# Patient Record
Sex: Female | Born: 1935 | Race: White | Hispanic: No | Marital: Married | Smoking: Never smoker
Health system: Southern US, Community
[De-identification: ages and names within clinical notes are randomized; demographics above are authoritative.]

## PROBLEM LIST (undated history)

## (undated) DIAGNOSIS — I10 Essential (primary) hypertension: Secondary | ICD-10-CM

## (undated) DIAGNOSIS — S2232XA Fracture of one rib, left side, initial encounter for closed fracture: Secondary | ICD-10-CM

## (undated) DIAGNOSIS — M1990 Unspecified osteoarthritis, unspecified site: Secondary | ICD-10-CM

## (undated) DIAGNOSIS — F419 Anxiety disorder, unspecified: Secondary | ICD-10-CM

## (undated) DIAGNOSIS — M810 Age-related osteoporosis without current pathological fracture: Secondary | ICD-10-CM

## (undated) DIAGNOSIS — E039 Hypothyroidism, unspecified: Secondary | ICD-10-CM

## (undated) DIAGNOSIS — E079 Disorder of thyroid, unspecified: Secondary | ICD-10-CM

## (undated) DIAGNOSIS — S32592A Other specified fracture of left pubis, initial encounter for closed fracture: Secondary | ICD-10-CM

## (undated) DIAGNOSIS — H3530 Unspecified macular degeneration: Secondary | ICD-10-CM

## (undated) DIAGNOSIS — J45909 Unspecified asthma, uncomplicated: Secondary | ICD-10-CM

## (undated) DIAGNOSIS — S42202A Unspecified fracture of upper end of left humerus, initial encounter for closed fracture: Secondary | ICD-10-CM

## (undated) HISTORY — DX: Anxiety disorder, unspecified: F41.9

## (undated) HISTORY — PX: EYE SURGERY: SHX253

## (undated) HISTORY — DX: Age-related osteoporosis without current pathological fracture: M81.0

## (undated) HISTORY — DX: Hypothyroidism, unspecified: E03.9

## (undated) HISTORY — DX: Unspecified macular degeneration: H35.30

## (undated) HISTORY — DX: Unspecified asthma, uncomplicated: J45.909

## (undated) HISTORY — PX: APPENDECTOMY: SHX54

---

## 1998-01-23 ENCOUNTER — Other Ambulatory Visit: Admission: RE | Admit: 1998-01-23 | Discharge: 1998-01-23 | Payer: Self-pay | Admitting: Internal Medicine

## 1998-12-13 ENCOUNTER — Ambulatory Visit (HOSPITAL_COMMUNITY): Admission: RE | Admit: 1998-12-13 | Discharge: 1998-12-13 | Payer: Self-pay | Admitting: Internal Medicine

## 1998-12-14 ENCOUNTER — Encounter: Payer: Self-pay | Admitting: Internal Medicine

## 1998-12-14 ENCOUNTER — Ambulatory Visit (HOSPITAL_COMMUNITY): Admission: RE | Admit: 1998-12-14 | Discharge: 1998-12-14 | Payer: Self-pay | Admitting: Internal Medicine

## 1999-02-27 ENCOUNTER — Encounter: Payer: Self-pay | Admitting: Internal Medicine

## 1999-02-27 ENCOUNTER — Encounter: Admission: RE | Admit: 1999-02-27 | Discharge: 1999-02-27 | Payer: Self-pay | Admitting: Internal Medicine

## 1999-03-13 ENCOUNTER — Ambulatory Visit (HOSPITAL_COMMUNITY): Admission: RE | Admit: 1999-03-13 | Discharge: 1999-03-13 | Payer: Self-pay | Admitting: Internal Medicine

## 2001-04-30 ENCOUNTER — Encounter: Payer: Self-pay | Admitting: Internal Medicine

## 2001-04-30 ENCOUNTER — Encounter: Admission: RE | Admit: 2001-04-30 | Discharge: 2001-04-30 | Payer: Self-pay | Admitting: Internal Medicine

## 2003-11-15 ENCOUNTER — Encounter (HOSPITAL_COMMUNITY): Admission: RE | Admit: 2003-11-15 | Discharge: 2004-02-13 | Payer: Self-pay | Admitting: Internal Medicine

## 2003-11-24 ENCOUNTER — Ambulatory Visit (HOSPITAL_COMMUNITY): Admission: RE | Admit: 2003-11-24 | Discharge: 2003-11-24 | Payer: Self-pay | Admitting: Internal Medicine

## 2004-11-15 ENCOUNTER — Encounter: Admission: RE | Admit: 2004-11-15 | Discharge: 2004-11-15 | Payer: Self-pay | Admitting: Internal Medicine

## 2005-11-18 ENCOUNTER — Encounter: Admission: RE | Admit: 2005-11-18 | Discharge: 2005-11-18 | Payer: Self-pay | Admitting: Internal Medicine

## 2007-10-01 ENCOUNTER — Emergency Department (HOSPITAL_COMMUNITY): Admission: EM | Admit: 2007-10-01 | Discharge: 2007-10-01 | Payer: Self-pay | Admitting: Emergency Medicine

## 2009-07-07 ENCOUNTER — Emergency Department (HOSPITAL_COMMUNITY): Admission: EM | Admit: 2009-07-07 | Discharge: 2009-07-07 | Payer: Self-pay | Admitting: Family Medicine

## 2011-05-02 ENCOUNTER — Encounter (INDEPENDENT_AMBULATORY_CARE_PROVIDER_SITE_OTHER): Payer: Medicare Other | Admitting: Ophthalmology

## 2011-05-02 DIAGNOSIS — H43819 Vitreous degeneration, unspecified eye: Secondary | ICD-10-CM

## 2011-05-02 DIAGNOSIS — H3530 Unspecified macular degeneration: Secondary | ICD-10-CM

## 2012-05-01 ENCOUNTER — Ambulatory Visit (INDEPENDENT_AMBULATORY_CARE_PROVIDER_SITE_OTHER): Payer: Medicare Other | Admitting: Ophthalmology

## 2012-05-04 ENCOUNTER — Ambulatory Visit (INDEPENDENT_AMBULATORY_CARE_PROVIDER_SITE_OTHER): Payer: Medicare Other | Admitting: Ophthalmology

## 2012-05-04 DIAGNOSIS — H35039 Hypertensive retinopathy, unspecified eye: Secondary | ICD-10-CM

## 2012-05-04 DIAGNOSIS — I10 Essential (primary) hypertension: Secondary | ICD-10-CM

## 2012-05-04 DIAGNOSIS — H3530 Unspecified macular degeneration: Secondary | ICD-10-CM

## 2012-05-04 DIAGNOSIS — H43819 Vitreous degeneration, unspecified eye: Secondary | ICD-10-CM

## 2013-02-07 ENCOUNTER — Encounter (HOSPITAL_COMMUNITY): Payer: Self-pay | Admitting: Emergency Medicine

## 2013-02-07 ENCOUNTER — Emergency Department (HOSPITAL_COMMUNITY)
Admission: EM | Admit: 2013-02-07 | Discharge: 2013-02-07 | Disposition: A | Discharge status: Home / Self Care | Payer: Medicare Other | Attending: Emergency Medicine | Admitting: Emergency Medicine

## 2013-02-07 DIAGNOSIS — Z79899 Other long term (current) drug therapy: Secondary | ICD-10-CM | POA: Insufficient documentation

## 2013-02-07 DIAGNOSIS — J3489 Other specified disorders of nose and nasal sinuses: Secondary | ICD-10-CM | POA: Insufficient documentation

## 2013-02-07 DIAGNOSIS — R5381 Other malaise: Secondary | ICD-10-CM | POA: Insufficient documentation

## 2013-02-07 DIAGNOSIS — R062 Wheezing: Secondary | ICD-10-CM | POA: Insufficient documentation

## 2013-02-07 DIAGNOSIS — R197 Diarrhea, unspecified: Secondary | ICD-10-CM | POA: Insufficient documentation

## 2013-02-07 DIAGNOSIS — R6883 Chills (without fever): Secondary | ICD-10-CM | POA: Insufficient documentation

## 2013-02-07 DIAGNOSIS — R112 Nausea with vomiting, unspecified: Secondary | ICD-10-CM

## 2013-02-07 DIAGNOSIS — I10 Essential (primary) hypertension: Secondary | ICD-10-CM | POA: Insufficient documentation

## 2013-02-07 DIAGNOSIS — R059 Cough, unspecified: Secondary | ICD-10-CM | POA: Insufficient documentation

## 2013-02-07 DIAGNOSIS — R630 Anorexia: Secondary | ICD-10-CM | POA: Insufficient documentation

## 2013-02-07 DIAGNOSIS — B349 Viral infection, unspecified: Secondary | ICD-10-CM

## 2013-02-07 DIAGNOSIS — M129 Arthropathy, unspecified: Secondary | ICD-10-CM | POA: Insufficient documentation

## 2013-02-07 DIAGNOSIS — R51 Headache: Secondary | ICD-10-CM | POA: Insufficient documentation

## 2013-02-07 DIAGNOSIS — IMO0002 Reserved for concepts with insufficient information to code with codable children: Secondary | ICD-10-CM | POA: Insufficient documentation

## 2013-02-07 DIAGNOSIS — E079 Disorder of thyroid, unspecified: Secondary | ICD-10-CM | POA: Insufficient documentation

## 2013-02-07 DIAGNOSIS — B9789 Other viral agents as the cause of diseases classified elsewhere: Secondary | ICD-10-CM | POA: Insufficient documentation

## 2013-02-07 DIAGNOSIS — R05 Cough: Secondary | ICD-10-CM | POA: Insufficient documentation

## 2013-02-07 HISTORY — DX: Disorder of thyroid, unspecified: E07.9

## 2013-02-07 HISTORY — DX: Essential (primary) hypertension: I10

## 2013-02-07 HISTORY — DX: Unspecified osteoarthritis, unspecified site: M19.90

## 2013-02-07 MED ORDER — ONDANSETRON 8 MG PO TBDP: 8.0000 mg | ORAL_TABLET | Freq: Two times a day (BID) | ORAL | Status: DC | PRN

## 2013-02-07 MED ORDER — ONDANSETRON 8 MG PO TBDP
8.0000 mg | ORAL_TABLET | Freq: Once | ORAL | Status: AC
  Administered 2013-02-07: 8 mg via ORAL
  Filled 2013-02-07: qty 1

## 2013-02-07 MED ORDER — ALBUTEROL SULFATE (5 MG/ML) 0.5% IN NEBU
5.0000 mg | INHALATION_SOLUTION | Freq: Once | RESPIRATORY_TRACT | Status: AC
  Administered 2013-02-07: 5 mg via RESPIRATORY_TRACT
  Filled 2013-02-07: qty 1

## 2013-02-07 NOTE — Discharge Instructions (Signed)
 Nausea and Vomiting Nausea is a sick feeling that often comes before throwing up (vomiting). Vomiting is a reflex where stomach contents come out of your mouth. Vomiting can cause severe loss of body fluids (dehydration). Children and elderly adults can become dehydrated quickly, especially if they also have diarrhea. Nausea and vomiting are symptoms of a condition or disease. It is important to find the cause of your symptoms. CAUSES   Direct irritation of the stomach lining. This irritation can result from increased acid production (gastroesophageal reflux disease), infection, food poisoning, taking certain medicines (such as nonsteroidal anti-inflammatory drugs), alcohol use, or tobacco use.  Signals from the brain.These signals could be caused by a headache, heat exposure, an inner ear disturbance, increased pressure in the brain from injury, infection, a tumor, or a concussion, pain, emotional stimulus, or metabolic problems.  An obstruction in the gastrointestinal tract (bowel obstruction).  Illnesses such as diabetes, hepatitis, gallbladder problems, appendicitis, kidney problems, cancer, sepsis, atypical symptoms of a heart attack, or eating disorders.  Medical treatments such as chemotherapy and radiation.  Receiving medicine that makes you sleep (general anesthetic) during surgery. DIAGNOSIS Your caregiver may ask for tests to be done if the problems do not improve after a few days. Tests may also be done if symptoms are severe or if the reason for the nausea and vomiting is not clear. Tests may include:  Urine tests.  Blood tests.  Stool tests.  Cultures (to look for evidence of infection).  X-rays or other imaging studies. Test results can help your caregiver make decisions about treatment or the need for additional tests. TREATMENT You need to stay well hydrated. Drink frequently but in small amounts.You may wish to drink water, sports drinks, clear broth, or eat frozen  ice pops or gelatin dessert to help stay hydrated.When you eat, eating slowly may help prevent nausea.There are also some antinausea medicines that may help prevent nausea. HOME CARE INSTRUCTIONS   Take all medicine as directed by your caregiver.  If you do not have an appetite, do not force yourself to eat. However, you must continue to drink fluids.  If you have an appetite, eat a normal diet unless your caregiver tells you differently.  Eat a variety of complex carbohydrates (rice, wheat, potatoes, bread), lean meats, yogurt, fruits, and vegetables.  Avoid high-fat foods because they are more difficult to digest.  Drink enough water and fluids to keep your urine clear or pale yellow.  If you are dehydrated, ask your caregiver for specific rehydration instructions. Signs of dehydration may include:  Severe thirst.  Dry lips and mouth.  Dizziness.  Dark urine.  Decreasing urine frequency and amount.  Confusion.  Rapid breathing or pulse. SEEK IMMEDIATE MEDICAL CARE IF:   You have blood or brown flecks (like coffee grounds) in your vomit.  You have black or bloody stools.  You have a severe headache or stiff neck.  You are confused.  You have severe abdominal pain.  You have chest pain or trouble breathing.  You do not urinate at least once every 8 hours.  You develop cold or clammy skin.  You continue to vomit for longer than 24 to 48 hours.  You have a fever. MAKE SURE YOU:   Understand these instructions.  Will watch your condition.  Will get help right away if you are not doing well or get worse. Document Released: 01/28/2005 Document Revised: 04/22/2011 Document Reviewed: 06/27/2010 [facility] Patient Information 2014 [place], [place].     Viral  Infections A viral infection can be caused by different types of viruses.Most viral infections are not serious and resolve on their own. However, some infections may cause severe symptoms and may lead to  further complications. SYMPTOMS Viruses can frequently cause:  Minor sore throat.  Aches and pains.  Headaches.  Runny nose.  Different types of rashes.  Watery eyes.  Tiredness.  Cough.  Loss of appetite.  Gastrointestinal infections, resulting in nausea, vomiting, and diarrhea. These symptoms do not respond to antibiotics because the infection is not caused by bacteria. However, you might catch a bacterial infection following the viral infection. This is sometimes called a superinfection. Symptoms of such a bacterial infection may include:  Worsening sore throat with pus and difficulty swallowing.  Swollen neck glands.  Chills and a high or persistent fever.  Severe headache.  Tenderness over the sinuses.  Persistent overall ill feeling (malaise), muscle aches, and tiredness (fatigue).  Persistent cough.  Yellow, green, or brown mucus production with coughing. HOME CARE INSTRUCTIONS   Only take over-the-counter or prescription medicines for pain, discomfort, diarrhea, or fever as directed by your caregiver.  Drink enough water and fluids to keep your urine clear or pale yellow. Sports drinks can provide valuable electrolytes, sugars, and hydration.  Get plenty of rest and maintain proper nutrition. Soups and broths with crackers or rice are fine. SEEK IMMEDIATE MEDICAL CARE IF:   You have severe headaches, shortness of breath, chest pain, neck pain, or an unusual rash.  You have uncontrolled vomiting, diarrhea, or you are unable to keep down fluids.  You or your child has an oral temperature above 102 F (38.9 C), not controlled by medicine.   MAKE SURE YOU:   Understand these instructions.  Will watch your condition.  Will get help right away if you are not doing well or get worse. Document Released: 11/07/2004 Document Revised: 04/22/2011 Document Reviewed: 06/04/2010 [facility] Patient Information 2014 [place], [place].

## 2013-02-07 NOTE — ED Notes (Signed)
Patient is from home. Patient c/o wheezing, cough, N/V, and frequent bowl movements. Patient has a history of asthma.

## 2013-02-07 NOTE — ED Notes (Signed)
Family at bedside. 

## 2013-02-07 NOTE — ED Notes (Signed)
Pt was Ambulated in the hallway on continuous pulse oximeter monitoring. Pt denies SOB or  Dizziness. Oxygenation level was between 93-96 with pulse of 106-115 bpm.

## 2013-02-07 NOTE — ED Notes (Addendum)
[name]'s onset 12/26. Post nasal drip, denies SOB. She states she has been taking Children Claritin and Delsym.  States she has heard some wheezes throughout the day.

## 2013-02-07 NOTE — ED Provider Notes (Signed)
CSN: [id number]     Arrival date & time 02/07/13  1757 History   First MD Initiated Contact with Patient 02/07/13 1949     No chief complaint on file.  (Consider location/radiation/quality/duration/timing/severity/associated sxs/prior Treatment) HPI Comments: Patient reports starting yesterday had some sinus pressure, mild headache, nasal congestion associated with mild dry cough. Today she has felt feverish and achy and has had nausea and vomiting, unable to keep down any food. She is able to keep down liquids. She was time to take Claritin for her sinus problems but actually threw up her last dose. Patient's daughter who is in the household has had recent URI symptoms, and her husband had influenza-like symptoms, being seen in the emergency department here last week. Patient reports that she did not have her influenza vaccination this year. She's had a few loose stools today as well. She reports she has a history of asthma, denies any significant shortness of breath. Cough has been dry nonproductive. She denies feeling lightheaded or faint. Stools are then loose, but not watery, not dark or black.  The history is provided by the patient and a relative.    Past Medical History  Diagnosis Date  . Hypertension   . Arthritis   . Thyroid disease    Past Surgical History  Procedure Laterality Date  . Appendectomy     History reviewed. No pertinent family history. History  Substance Use Topics  . Smoking status: Never Smoker   . Smokeless tobacco: Not on file  . Alcohol Use: No   OB History   Grav Para Term Preterm Abortions TAB SAB Ect Mult Living                 Review of Systems  Constitutional: Positive for chills, appetite change and fatigue. Negative for fever.  HENT: Positive for congestion, postnasal drip, sinus pressure and sneezing. Negative for trouble swallowing and voice change.   Respiratory: Positive for cough. Negative for chest tightness, shortness of breath and  wheezing.   Cardiovascular: Negative for chest pain.  Gastrointestinal: Positive for nausea, vomiting and diarrhea. Negative for abdominal pain.  Musculoskeletal: Negative for myalgias, neck pain and neck stiffness.  Skin: Negative for rash.  Neurological: Positive for headaches. Negative for syncope and light-headedness.  All other systems reviewed and are negative.    Allergies  Review of patient's allergies indicates no known allergies.  Home Medications   Current Outpatient Rx  Name  Route  Sig  Dispense  Refill  . amLODipine (NORVASC) 5 MG tablet   Oral   Take 5 mg by mouth daily.         . fluticasone (FLOVENT HFA) 110 MCG/ACT inhaler   Inhalation   Inhale 1 puff into the lungs 2 (two) times daily.         Marland Kitchen levothyroxine (SYNTHROID, LEVOTHROID) 25 MCG tablet   Oral   Take 25 mcg by mouth daily before breakfast.         . loratadine (CLARITIN) 5 MG/5ML syrup   Oral   Take 10 mg by mouth daily as needed for allergies or rhinitis.         Marland Kitchen telmisartan-hydrochlorothiazide (MICARDIS HCT) 80-25 MG per tablet   Oral   Take 1 tablet by mouth daily.         . ondansetron (ZOFRAN-ODT) 8 MG disintegrating tablet   Oral   Take 1 tablet (8 mg total) by mouth every 12 (twelve) hours as needed for nausea.   20  tablet   0    BP 167/77  Pulse 93  Temp(Src) 98.4 F (36.9 C) (Oral)  SpO2 88% Physical Exam  Nursing note and vitals reviewed. Constitutional: She appears well-developed and well-nourished. No distress.  HENT:  Head: Normocephalic and atraumatic.  Right Ear: External ear normal.  Left Ear: External ear normal.  Nose: Rhinorrhea present. No mucosal edema.  Mouth/Throat: Uvula is midline, oropharynx is clear and moist and mucous membranes are normal. No oropharyngeal exudate.  Eyes: Conjunctivae and EOM are normal. No scleral icterus.  Neck: Normal range of motion. Neck supple. No tracheal deviation present.  Cardiovascular: Normal rate, regular  rhythm and intact distal pulses.   Pulmonary/Chest: Effort normal. No stridor. No respiratory distress. She has wheezes.  Abdominal: Soft. She exhibits no distension. There is no tenderness. There is no rebound.  Lymphadenopathy:    She has no cervical adenopathy.  Neurological: She is alert.  Skin: She is not diaphoretic.    ED Course  Procedures (including critical care time) Labs Review Labs Reviewed - No data to display Imaging Review No results found.  EKG Interpretation   None      RA sat is 93% and I interpret to be marginally low  Pt had a low O2 read of 88% , but I believe false negative, pt is not SOB, able to ambulate and maintained a sat of 93-95% with ambulating, nausea improved, tolerating liquids.  If pt can eat solids, will d./c with PO zofran prescription and isntructions to follow up with PCP this week, return if worse.  Pt and family are ok with plan.   MDM   1. Nausea and vomiting in adult   2. Viral illness      Pt with probably influenza like symptoms.  However, only low grade temp, no myalgias.  Will treat with neb treatment, PO zofran and reassess.  Discussed pros and cons of Tamiflu and pt and family agree to defer Tamiflu use.        Gavin Pound. Oletta Lamas, MD 02/07/13 2252

## 2013-05-05 ENCOUNTER — Ambulatory Visit (INDEPENDENT_AMBULATORY_CARE_PROVIDER_SITE_OTHER): Payer: Medicare Other | Admitting: Ophthalmology

## 2013-05-27 ENCOUNTER — Ambulatory Visit (INDEPENDENT_AMBULATORY_CARE_PROVIDER_SITE_OTHER): Payer: Medicare Other | Admitting: Ophthalmology

## 2013-05-27 DIAGNOSIS — H1851 Endothelial corneal dystrophy: Secondary | ICD-10-CM

## 2013-05-27 DIAGNOSIS — I10 Essential (primary) hypertension: Secondary | ICD-10-CM

## 2013-05-27 DIAGNOSIS — H35039 Hypertensive retinopathy, unspecified eye: Secondary | ICD-10-CM

## 2013-05-27 DIAGNOSIS — H18519 Endothelial corneal dystrophy, unspecified eye: Secondary | ICD-10-CM

## 2013-05-27 DIAGNOSIS — H3530 Unspecified macular degeneration: Secondary | ICD-10-CM

## 2013-05-27 DIAGNOSIS — H43819 Vitreous degeneration, unspecified eye: Secondary | ICD-10-CM

## 2014-06-01 ENCOUNTER — Ambulatory Visit (INDEPENDENT_AMBULATORY_CARE_PROVIDER_SITE_OTHER): Payer: Medicare Other | Admitting: Ophthalmology

## 2014-06-07 ENCOUNTER — Encounter: Payer: Self-pay | Admitting: *Deleted

## 2014-06-07 ENCOUNTER — Ambulatory Visit (INDEPENDENT_AMBULATORY_CARE_PROVIDER_SITE_OTHER): Payer: Medicare Other | Admitting: Emergency Medicine

## 2014-06-07 ENCOUNTER — Encounter: Payer: Self-pay | Admitting: Emergency Medicine

## 2014-06-07 VITALS — BP 136/60 | HR 74 | Ht 60.0 in | Wt 112.0 lb

## 2014-06-07 DIAGNOSIS — J4521 Mild intermittent asthma with (acute) exacerbation: Secondary | ICD-10-CM

## 2014-06-07 MED ORDER — AEROCHAMBER MINI CHAMBER DEVI: Status: DC

## 2014-06-07 MED ORDER — BUDESONIDE-FORMOTEROL FUMARATE 160-4.5 MCG/ACT IN AERO: 2.0000 | INHALATION_SPRAY | Freq: Two times a day (BID) | RESPIRATORY_TRACT | Status: DC

## 2014-06-07 NOTE — Progress Notes (Signed)
Subjective:    Patient ID: Meagan Osborn, female    DOB: [birth year], 81 y.o.   MRN: [id number]  HPI 81 year old never smoker with history of hypertension,hypothyroidism,allergic rhinitis (sens to cats and almonds, molds). She was given the diagnosis of asthma in her mid 1420s. She's been managed on . For many years her sx have been seasonal. She has a lot of nasal drainage, has SOB that seems to be trigger related. She has been on loratadine before with benefit. She gets dyspnea and can feel chest and neck tightness. Since 11-12/15 she has not had good control with flovent. She doesn't use albuterol because she doesn't like the side effects. She had an increase in sx and exposures in February, had more sx. She was changed to Advair qd in March to see if she would benefit. Her wheeze is probably decreased some, she overall feels that she is still dyspneic. Minimal cough, UA wheeze is notable to family.    Review of Systems  Constitutional: Negative for fever and unexpected weight change.  HENT: Positive for sneezing. Negative for congestion, dental problem, ear pain, nosebleeds, postnasal drip, rhinorrhea, sinus pressure, sore throat and trouble swallowing.   Eyes: Negative for redness and itching.  Respiratory: Negative for cough, chest tightness, shortness of breath and wheezing.   Cardiovascular: Negative for palpitations and leg swelling.  Gastrointestinal: Negative for nausea and vomiting.  Genitourinary: Negative for dysuria.  Musculoskeletal: Negative for joint swelling.  Skin: Negative for rash.  Neurological: Negative for headaches.  Hematological: Does not bruise/bleed easily.  Psychiatric/Behavioral: Positive for dysphoric mood. The patient is nervous/anxious.    Past Medical History  Diagnosis Date  . Hypertension   . Arthritis   . Thyroid disease   . Anxiety   . Asthma   . Hypothyroidism   . Macular degeneration   . Osteoporosis      No family history on file.    History   Social History  . Marital Status: Married    Spouse Name: N/A  . Number of Children: N/A  . Years of Education: N/A   Occupational History  . Not on file.   Social History Main Topics  . Smoking status: Never Smoker   . Smokeless tobacco: Not on file  . Alcohol Use: No  . Drug Use: Not on file  . Sexual Activity: Not on file   Other Topics Concern  . Not on file   Social History Narrative     No Known Allergies   Outpatient Prescriptions Prior to Visit  Medication Sig Dispense Refill  . amLODipine (NORVASC) 2.5 MG tablet Take 1 tablet by mouth daily.  5  . levothyroxine (SYNTHROID, LEVOTHROID) 25 MCG tablet Take 25 mcg by mouth daily before breakfast.    . telmisartan-hydrochlorothiazide (MICARDIS HCT) 80-25 MG per tablet Take 1 tablet by mouth daily.    Marland Kitchen. albuterol (PROVENTIL HFA;VENTOLIN HFA) 108 (90 BASE) MCG/ACT inhaler Inhale 1-2 puffs into the lungs every 4 (four) hours as needed for wheezing or shortness of breath.    . fluticasone (FLOVENT HFA) 110 MCG/ACT inhaler Inhale 1 puff into the lungs 2 (two) times daily.     No facility-administered medications prior to visit.         Objective:   Physical Exam Filed Vitals:   06/07/14 1138  BP: 136/60  Pulse: 74  Height: 5' (1.524 m)  Weight: 112 lb (50.803 kg)  SpO2: 99%   Gen: Pleasant, well-nourished, elderly woman, in no distress,  normal affect  ENT: No lesions,  mouth clear,  oropharynx clear, no postnasal drip  Neck: No JVD, no TMG, no carotid bruits  Lungs: No use of accessory muscles, clear without rales or rhonchi  Cardiovascular: RRR, heart sounds normal, no murmur or gallops, no peripheral edema  Musculoskeletal: No deformities, no cyanosis or clubbing  Neuro: alert, non focal  Skin: Warm, no lesions or rashes     Assessment & Plan:  No problem-specific assessment & plan notes found for this encounter.

## 2014-06-07 NOTE — Patient Instructions (Signed)
Please restart your Children's Loratadine once a day every day Stop Advair Start Symbicort 160/4.5 g, 2 puffs twice a day every day through a spacer. Please rinse and gargle after using Albuterol 1 puff as needed for shortness of breath will be your rescue inhaler Follow with Dr Delton CoombesByrum in 2 months or sooner if you have any problems.

## 2014-08-16 ENCOUNTER — Ambulatory Visit (INDEPENDENT_AMBULATORY_CARE_PROVIDER_SITE_OTHER)
Admission: RE | Admit: 2014-08-16 | Discharge: 2014-08-16 | Disposition: A | Discharge status: Home / Self Care | Payer: Medicare Other | Source: Ambulatory Visit | Attending: Adult Health | Admitting: Adult Health

## 2014-08-16 ENCOUNTER — Ambulatory Visit (INDEPENDENT_AMBULATORY_CARE_PROVIDER_SITE_OTHER): Payer: Medicare Other | Admitting: Adult Health

## 2014-08-16 ENCOUNTER — Encounter: Payer: Self-pay | Admitting: Adult Health

## 2014-08-16 ENCOUNTER — Telehealth: Payer: Self-pay | Admitting: Adult Health

## 2014-08-16 VITALS — BP 102/60 | HR 80 | Temp 97.7°F | Ht 60.0 in | Wt 114.0 lb

## 2014-08-16 DIAGNOSIS — J4531 Mild persistent asthma with (acute) exacerbation: Secondary | ICD-10-CM

## 2014-08-16 DIAGNOSIS — J45909 Unspecified asthma, uncomplicated: Secondary | ICD-10-CM

## 2014-08-16 MED ORDER — BUDESONIDE-FORMOTEROL FUMARATE 80-4.5 MCG/ACT IN AERO: 2.0000 | INHALATION_SPRAY | Freq: Two times a day (BID) | RESPIRATORY_TRACT | Status: DC

## 2014-08-16 MED ORDER — LEVALBUTEROL TARTRATE 45 MCG/ACT IN AERO: 2.0000 | INHALATION_SPRAY | RESPIRATORY_TRACT | Status: DC | PRN

## 2014-08-16 MED ORDER — LEVALBUTEROL HCL 0.63 MG/3ML IN NEBU
0.6300 mg | INHALATION_SOLUTION | Freq: Once | RESPIRATORY_TRACT | Status: AC
  Administered 2014-08-16: 0.63 mg via RESPIRATORY_TRACT

## 2014-08-16 NOTE — Progress Notes (Signed)
Quick Note:  LVM for pt to return call ______ 

## 2014-08-16 NOTE — Patient Instructions (Addendum)
Chest xray today .  Stop Flovent .  Begin Symbicort 80/4.755mcg 2 puffs in am . -Controller inhaler  May use Xopenex Inhaler 1-2 puffs every 4hrs as needed for wheezing /shortness of breath -this is your rescue inhaler.  Use saline nasal spray rinses As needed  -spray into nose, wait then blow nose.  Use claritin daily As needed  Sinus congestion /drainage.  Follow up Dr. Delton CoombesByrum  In 1 month and As needed   Please contact office for sooner follow up if symptoms do not improve or worsen or seek emergency care

## 2014-08-16 NOTE — Assessment & Plan Note (Signed)
Mild flare -spirometry shows moderate obstruction  xopenex 1/2 vial neb given in office with improved symptoms and no perceived intolerance.  Check cxr today  Will retry symbicort at lower 1/2 dose to if more tolerable.    Plan  Chest xray today .  Stop Flovent .  Begin Symbicort 80/4.355mcg 2 puffs in am . -Controller inhaler  May use Xopenex Inhaler 1-2 puffs every 4hrs as needed for wheezing /shortness of breath -this is your rescue inhaler.  Use saline nasal spray rinses As needed  -spray into nose, wait then blow nose.  Use claritin daily As needed  Sinus congestion /drainage.  Follow up Dr. Delton CoombesByrum  In 1 month and As needed   Please contact office for sooner follow up if symptoms do not improve or worsen or seek emergency care

## 2014-08-16 NOTE — Telephone Encounter (Signed)
Results given to daughter. Nothing further needed.

## 2014-08-16 NOTE — Progress Notes (Signed)
   Subjective:    Patient ID: Meagan Osborn, female    DOB: 1920/11/24, 81 y.o.   MRN: [id number]  HPI 81 yo female never smoker with long hx of asthma seen for pulmonary consult April 2016 with Dr. Delton CoombesByrum    08/16/2014 Follow up : Asthma  Pt returns for 2 month follow up for asthma  Seen last ov with increased asthma and allergic rhinitis symptoms .  She was changed from Flovent to Symbicort.  Pt says she tried the 2 week sample but thinks it made her feel bad or could not tolerate. Does not remember the exact reason.  Previously on advair but did not tolerate in past.  Continues to have sinus congestion and fullness.  Gets winded on/off and tightness.  Does not use  albuterol , is expired. Says it makes her nervous.  Is sensitive to medications.  No chest pain, syncope, hemoptysis, fever, cough, orthopnea or edema.  Spirometry today shows FEV1 60%, ratio 54. Decreased mid flows at 40%. She has cats but does allow in BR. Has had allergy eval in past.  Can tolerate children claritin.  Does not tolerate steroids in past.        Review of Systems Constitutional:   No  weight loss, night sweats,  Fevers, chills, fatigue, or  lassitude.  HEENT:   No headaches,  Difficulty swallowing,  Tooth/dental problems, or  Sore throat,                No sneezing, itching, ear ache, + nasal congestion, post nasal drip,   CV:  No chest pain,  Orthopnea, PND, swelling in lower extremities, anasarca, dizziness, palpitations, syncope.   GI  No heartburn, indigestion, abdominal pain, nausea, vomiting, diarrhea, change in bowel habits, loss of appetite, bloody stools.   Resp: +_shortness of breath with exertion    No excess mucus, no productive cough,  No non-productive cough,  No coughing up of blood.  No change in color of mucus.  Occasional wheezing.  No chest wall deformity  Skin: no rash or lesions.  GU: no dysuria, change in color of urine, no urgency or frequency.  No flank pain, no  hematuria   MS:  No joint pain or swelling.  No decreased range of motion.  No back pain.  Psych:  No change in mood or affect. No depression or anxiety.  No memory loss.         Objective:   Physical Exam  GEN: A/Ox3; pleasant , NAD, frail and elderly   HEENT:  [place]/AT,  EACs-clear, TMs-wnl, NOSE-clear drainage , THROAT-clear, no lesions, no postnasal drip or exudate noted.   NECK:  Supple w/ fair ROM; no JVD; normal carotid impulses w/o bruits; no thyromegaly or nodules palpated; no lymphadenopathy.  RESP  Clear  P & A; w/o, wheezes/ rales/ or rhonchi.no accessory muscle use, no dullness to percussion  CARD:  RRR, no m/r/g  , no peripheral edema, pulses intact, no cyanosis or clubbing.  GI:   Soft & nt; nml bowel sounds; no organomegaly or masses detected.  Musco: Warm bil, no deformities or joint swelling noted.   Neuro: alert, no focal deficits noted.    Skin: Warm, no lesions or rashes        Assessment & Plan:

## 2014-08-23 ENCOUNTER — Telehealth: Payer: Self-pay | Admitting: *Deleted

## 2014-08-23 NOTE — Telephone Encounter (Signed)
Pts ins will not cover Xopenex. Pt must first try [employer] Air HFA-Pro Air Respiclick. Which would you like to change to?

## 2014-08-23 NOTE — Telephone Encounter (Signed)
Called and spoke to pharmacy tech at [facility]. Pt picked up Xopenex on 7.11.16 and paid $88.  Xopenex is needing a PA. Called [facility] at [phone], pt's ID#: [id number].  Called [employer] and spoke to [place]. PA was approved until 12.31.16. Confirmation number: [id number]. LMTCB for daughter to inform her.   Will send to TP as FYI.

## 2014-08-23 NOTE — Telephone Encounter (Signed)
I know , I explained that it would not be covered .  She is intolerant to albuterol effects so that is why we used xopenex .  How much is it?

## 2014-08-24 NOTE — Telephone Encounter (Signed)
Spoke with the pt's daughter and notified of the below recs  She verbalized understanding  Nothing further needed

## 2014-09-16 ENCOUNTER — Ambulatory Visit: Payer: Medicare Other | Admitting: Adult Health

## 2014-09-26 ENCOUNTER — Encounter: Payer: Self-pay | Admitting: Emergency Medicine

## 2014-09-26 ENCOUNTER — Ambulatory Visit (INDEPENDENT_AMBULATORY_CARE_PROVIDER_SITE_OTHER): Payer: Medicare Other | Admitting: Emergency Medicine

## 2014-09-26 VITALS — BP 130/74 | HR 67 | Ht 60.0 in | Wt 114.0 lb

## 2014-09-26 DIAGNOSIS — J309 Allergic rhinitis, unspecified: Secondary | ICD-10-CM

## 2014-09-26 DIAGNOSIS — J4531 Mild persistent asthma with (acute) exacerbation: Secondary | ICD-10-CM | POA: Diagnosis not present

## 2014-09-26 MED ORDER — BUDESONIDE-FORMOTEROL FUMARATE 80-4.5 MCG/ACT IN AERO: 2.0000 | INHALATION_SPRAY | Freq: Every day | RESPIRATORY_TRACT | Status: DC

## 2014-09-26 NOTE — Progress Notes (Signed)
   Subjective:    Patient ID: Meagan Osborn, female    DOB: 1921-01-12, 81 y.o.   MRN: [id number]  Asthma Her past medical history is significant for asthma.   81 yo female never smoker with long hx of asthma seen for pulmonary consult April 2016 with Dr. Delton Coombes    Follow up : Asthma 08/16/14 Pt returns for 2 month follow up for asthma  Seen last ov with increased asthma and allergic rhinitis symptoms .  She was changed from Flovent to Symbicort.  Pt says she tried the 2 week sample but thinks it made her feel bad or could not tolerate. Does not remember the exact reason.  Previously on advair but did not tolerate in past.  Continues to have sinus congestion and fullness.  Gets winded on/off and tightness.  Does not use  albuterol , is expired. Says it makes her nervous.  Is sensitive to medications.  No chest pain, syncope, hemoptysis, fever, cough, orthopnea or edema.  Spirometry today shows FEV1 60%, ratio 54. Decreased mid flows at 40%. She has cats but does allow in BR. Has had allergy eval in past.  Can tolerate children claritin.  Does not tolerate steroids in past.   ROV 09/26/14 -- follow-up visit for asthma. We tried stopping Advair and starting Symbicort at my last visit with her. She did not tolerate this so at her last visit it was recommended that she start half dose, 2 puffs qd. She was also started on nasal saline and loratadine. Her breathing has been better, daughter is assisting with hx and agrees. She has freq sneezing and congestion.      Review of Systems As per HPI    Objective:   Physical Exam Filed Vitals:   09/26/14 1509  BP: 130/74  Pulse: 67  Height: 5' (1.524 m)  Weight: 114 lb (51.71 kg)  SpO2: 92%    GEN: A/Ox3; pleasant , NAD, frail and elderly   HEENT:  [place]/AT,  EACs-clear, TMs-wnl, NOSE-clear drainage , THROAT-clear, no lesions, no postnasal drip or exudate noted.   NECK:  No stridor.   RESP  Clear  P & A; w/o, wheezes/ rales/ or  rhonchi.no accessory muscle use, no dullness to percussion  CARD:  RRR, no m/r/g  , no peripheral edema, pulses intact, no cyanosis or club  Musco: Warm bil, no deformities or joint swelling noted.   Neuro: alert, no focal deficits noted.    Skin: Warm, no lesions or rashes      Assessment & Plan:  Allergic rhinitis She'll like to avoid taking scheduled medications. She has not used nasal saline and she only uses an antihistamine when necessary. She will continue to use children's Zyrtec as needed  Asthma, chronic Continue Symbicort 80/4.5 g 2 puffs once a day. If she begins to have symptoms the end of the day that I will change her to 1 puff twice a day Xopenex when necessary

## 2014-09-26 NOTE — Assessment & Plan Note (Signed)
She'll like to avoid taking scheduled medications. She has not used nasal saline and she only uses an antihistamine when necessary. She will continue to use children's Zyrtec as needed

## 2014-09-26 NOTE — Assessment & Plan Note (Signed)
Continue Symbicort 80/4.5 g 2 puffs once a day. If she begins to have symptoms the end of the day that I will change her to 1 puff twice a day Xopenex when necessary

## 2014-09-26 NOTE — Patient Instructions (Addendum)
Please continue Symbicort 80/4.5 g, 2 puffs once a day Use Xopenex as needed Continue your children's Zyrtec. You may benefit from taking this every day Follow with Dr Delton Coombes in 6 months or sooner if you have any problems

## 2014-09-27 ENCOUNTER — Ambulatory Visit: Payer: Medicare Other | Admitting: Emergency Medicine

## 2015-04-04 ENCOUNTER — Encounter: Payer: Self-pay | Admitting: Emergency Medicine

## 2015-04-04 ENCOUNTER — Ambulatory Visit (INDEPENDENT_AMBULATORY_CARE_PROVIDER_SITE_OTHER): Payer: Medicare Other | Admitting: Emergency Medicine

## 2015-04-04 VITALS — BP 138/60 | HR 81 | Wt 114.0 lb

## 2015-04-04 DIAGNOSIS — J4531 Mild persistent asthma with (acute) exacerbation: Secondary | ICD-10-CM

## 2015-04-04 DIAGNOSIS — J309 Allergic rhinitis, unspecified: Secondary | ICD-10-CM

## 2015-04-04 NOTE — Progress Notes (Signed)
   Subjective:    Patient ID: Meagan Osborn, female    DOB: 1920/06/21, 82 y.o.   MRN: [id number]  Asthma Her past medical history is significant for asthma.   82 yo female never smoker with long hx of asthma seen for pulmonary consult April 2016 with Dr. Delton Coombes    Follow up : Asthma 08/16/14 Pt returns for 2 month follow up for asthma  Seen last ov with increased asthma and allergic rhinitis symptoms .  She was changed from Flovent to Symbicort.  Pt says she tried the 2 week sample but thinks it made her feel bad or could not tolerate. Does not remember the exact reason.  Previously on advair but did not tolerate in past.  Continues to have sinus congestion and fullness.  Gets winded on/off and tightness.  Does not use  albuterol , is expired. Says it makes her nervous.  Is sensitive to medications.  No chest pain, syncope, hemoptysis, fever, cough, orthopnea or edema.  Spirometry today shows FEV1 60%, ratio 54. Decreased mid flows at 40%. She has cats but does allow in BR. Has had allergy eval in past.  Can tolerate children claritin.  Does not tolerate steroids in past.   ROV 09/26/14 -- follow-up visit for asthma. We tried stopping Advair and starting Symbicort at my last visit with her. She did not tolerate this so at her last visit it was recommended that she start half dose, 2 puffs qd. She was also started on nasal saline and loratadine. Her breathing has been better, daughter is assisting with hx and agrees. She has freq sneezing and congestion.   ROV 04/04/15 --  Follow up visit for asthma and chronic obstructive disease. At our last visit we changed her to Symbicort 80/4.5, 2 puffs once a day. She did well thru the Fall, no flares.  She tells me that she believe that her head feels funny, a little foggy. Her daughter has noticed some increased dyspnea, happens once every few weeks. Difficult to tease out whether this is anxiety, UA sx. She has not taken xopenex at these times.       Review of Systems As per HPI    Objective:   Physical Exam Filed Vitals:   04/04/15 1339  BP: 138/60  Pulse: 81  Weight: 114 lb (51.71 kg)  SpO2: 96%    GEN: A/Ox3; pleasant , NAD, frail and elderly   HEENT:  [place]/AT,  EACs-clear, TMs-wnl, NOSE-clear drainage , THROAT-clear, no lesions, no postnasal drip or exudate noted.   NECK:  No stridor.   RESP  Clear  P & A; w/o, wheezes/ rales/ or rhonchi.no accessory muscle use, no dullness to percussion  CARD:  RRR, no m/r/g  , no peripheral edema, pulses intact, no cyanosis or club  Musco: Warm bil, no deformities or joint swelling noted.   Neuro: alert, no focal deficits noted.    Skin: Warm, no lesions or rashes      Assessment & Plan:  Allergic rhinitis Restart loratadine 5 mg daily this spring when allergies become worse.   Asthma, chronic Based on her persistent episodes of dyspnea I like to increase her Symbicort twice a day to see if she tolerates. I also discussed with her using her Xopenex more strategically during these episodes and then reporting back to me whether she benefits.

## 2015-04-04 NOTE — Assessment & Plan Note (Signed)
Restart loratadine 5 mg daily this spring when allergies become worse.

## 2015-04-04 NOTE — Assessment & Plan Note (Signed)
Based on her persistent episodes of dyspnea I like to increase her Symbicort twice a day to see if she tolerates. I also discussed with her using her Xopenex more strategically during these episodes and then reporting back to me whether she benefits.

## 2015-04-04 NOTE — Patient Instructions (Signed)
Please continue to take Symbicort. We will increase this to 2 puffs twice a day  Try using your xopenex at times when your breathing gets difficult. Keep track of whether you benefit from this medication so we can decide how to recommend using this in the future.  Please continue your other medications as you have been taking them  Follow with Dr Delton Coombes in 6 months or sooner if you have any problems

## 2015-05-17 ENCOUNTER — Other Ambulatory Visit: Payer: Self-pay | Admitting: Adult Health

## 2015-10-02 ENCOUNTER — Ambulatory Visit: Payer: Medicare Other | Admitting: Emergency Medicine

## 2015-11-11 ENCOUNTER — Encounter (HOSPITAL_COMMUNITY): Payer: Self-pay | Admitting: *Deleted

## 2015-11-11 ENCOUNTER — Emergency Department (HOSPITAL_COMMUNITY)
Admission: EM | Admit: 2015-11-11 | Discharge: 2015-11-11 | Disposition: A | Discharge status: Home / Self Care | Payer: Medicare Other | Attending: Emergency Medicine | Admitting: Emergency Medicine

## 2015-11-11 ENCOUNTER — Emergency Department (HOSPITAL_COMMUNITY): Payer: Medicare Other

## 2015-11-11 DIAGNOSIS — E039 Hypothyroidism, unspecified: Secondary | ICD-10-CM | POA: Diagnosis not present

## 2015-11-11 DIAGNOSIS — Z79899 Other long term (current) drug therapy: Secondary | ICD-10-CM | POA: Diagnosis not present

## 2015-11-11 DIAGNOSIS — I10 Essential (primary) hypertension: Secondary | ICD-10-CM | POA: Diagnosis not present

## 2015-11-11 DIAGNOSIS — J45909 Unspecified asthma, uncomplicated: Secondary | ICD-10-CM | POA: Diagnosis present

## 2015-11-11 LAB — CBG MONITORING, ED: Glucose-Capillary: 80 mg/dL (ref 65–99)

## 2015-11-11 MED ORDER — LEVALBUTEROL HCL 0.63 MG/3ML IN NEBU
0.6300 mg | INHALATION_SOLUTION | Freq: Once | RESPIRATORY_TRACT | Status: AC
  Administered 2015-11-11: 0.63 mg via RESPIRATORY_TRACT
  Filled 2015-11-11: qty 3

## 2015-11-11 MED ORDER — LEVALBUTEROL TARTRATE 45 MCG/ACT IN AERO: 2.0000 | INHALATION_SPRAY | Freq: Once | RESPIRATORY_TRACT | Status: DC

## 2015-11-11 MED ORDER — LEVALBUTEROL TARTRATE 45 MCG/ACT IN AERO: 2.0000 | INHALATION_SPRAY | RESPIRATORY_TRACT | 1 refills | Status: DC | PRN

## 2015-11-11 MED ORDER — LEVALBUTEROL TARTRATE 45 MCG/ACT IN AERO
2.0000 | INHALATION_SPRAY | Freq: Once | RESPIRATORY_TRACT | Status: AC
  Administered 2015-11-11: 2 via RESPIRATORY_TRACT
  Filled 2015-11-11: qty 15

## 2015-11-11 NOTE — ED Triage Notes (Signed)
Pt complains of shortness of breath for the past 4 days. Pt has hx of asthma and takes Symbicort, states she has not been taking it regularly. Pt states she has not used her rescue inhaler. Pt states she is unsure how to use her rescue inhaler.

## 2015-11-11 NOTE — ED Notes (Addendum)
Upon introducing self to pt. Family states pt has not eaten and "sugar drops rapidly" if she has not. Pt/family educated NPO until after MD assessment, work, and/or [place]. CBG checked to reassure pt stability, CBG 80 at this time.

## 2015-11-11 NOTE — ED Provider Notes (Addendum)
[facility]-EMERGENCY DEPT Provider Note   CSN: [id number] Arrival date & time: 11/11/15  1143     History   Chief Complaint Chief Complaint  Patient presents with  . Asthma    HPI Meagan Osborn is a 82 y.o. female.  HPI Patient has a history of asthma and is taking her Symbicort daily but has not used any rescue inhaler.  She presents the ER for evaluation.  Family reports that her breathing became slightly worse just prior to arrival but now has since improved and is near baseline at this time.  Family reports the patient is noncompliant with her medications and specifically does not completely understand how to use her rescue inhaler.  The patient tends to be concerned about the elevation in blood pressure and heart rate that her rescue inhaler causes that she does not use it regularly.  No fevers or chills.  Denies productive cough.  No chest pain.  No shortness of breath at this time.  Denies abdominal pain.   Past Medical History:  Diagnosis Date  . Anxiety   . Arthritis   . Asthma   . Hypertension   . Hypothyroidism   . Macular degeneration   . Osteoporosis   . Thyroid disease     Patient Active Problem List   Diagnosis Date Noted  . Allergic rhinitis 09/26/2014  . Asthma, chronic 08/16/2014    Past Surgical History:  Procedure Laterality Date  . APPENDECTOMY    . EYE SURGERY      OB History    No data available       Home Medications    Prior to Admission medications   Medication Sig Start Date End Date Taking? Authorizing Provider  amLODipine (NORVASC) 2.5 MG tablet Take 2.5 mg by mouth 2 (two) times daily.  03/15/14  Yes Historical Provider, MD  levothyroxine (SYNTHROID, LEVOTHROID) 25 MCG tablet Take 25 mcg by mouth daily before breakfast.   Yes Historical Provider, MD  loratadine (CLARITIN) 10 MG tablet Take 10 mg by mouth daily.   Yes Historical Provider, MD  SYMBICORT 80-4.5 MCG/ACT inhaler INHALE 2 PUFFS INTO THE LUNGS TWICE A DAY 05/17/15  Yes Leslye Peerobert S  Byrum, MD  telmisartan-hydrochlorothiazide (MICARDIS HCT) 80-25 MG per tablet Take 1 tablet by mouth daily.   Yes Historical Provider, MD  levalbuterol [facility](XOPENEX HFA) 45 MCG/ACT inhaler Inhale 2 puffs into the lungs every 4 (four) hours as needed for wheezing or shortness of breath. 11/11/15   Azalia BilisKevin Elizah Lydon, MD    Family History No family history on file.  Social History Social History  Substance Use Topics  . Smoking status: Never Smoker  . Smokeless tobacco: Never Used  . Alcohol use No     Allergies   Review of patient's allergies indicates no known allergies.   Review of Systems Review of Systems  All other systems reviewed and are negative.    Physical Exam Updated Vital Signs BP 158/63 (BP Location: Left Arm)   Pulse 86   Temp 97.7 F (36.5 C) (Oral)   Resp 16   Ht 4\' 10"  (1.473 m)   Wt 115 lb (52.2 kg)   SpO2 99%   BMI 24.04 kg/m   Physical Exam  Constitutional: She is oriented to person, place, and time. She appears well-developed and well-nourished. No distress.  HENT:  Head: Normocephalic and atraumatic.  Eyes: EOM are normal.  Neck: Normal range of motion.  Cardiovascular: Normal rate, regular rhythm and normal heart sounds.  Pulmonary/Chest: Effort normal. She has wheezes.  Abdominal: Soft. She exhibits no distension. There is no tenderness.  Musculoskeletal: Normal range of motion.  Neurological: She is alert and oriented to person, place, and time.  Skin: Skin is warm and dry.  Psychiatric: She has a normal mood and affect. Judgment normal.  Nursing note and vitals reviewed.    ED Treatments / Results  Labs (all labs ordered are listed, but only abnormal results are displayed) Labs Reviewed  CBG MONITORING, ED    EKG  EKG Interpretation None       Radiology Dg Chest 2 View  Result Date: 11/11/2015 CLINICAL DATA:  82 year old female with history of asthma attack today. Hypertension. Nonsmoker. EXAM: CHEST  2 VIEW COMPARISON:  Chest  x-ray 08/16/2014. FINDINGS: Multiple old dense calcified granulomas are again noted throughout the lungs bilaterally. On the lateral projection, there is a new nodular density measuring 12 mm projecting over the region of the left atrium either within the anterior aspect of the left lower lobe or right lower lobe, which cannot be definitively identified on the prior study from 08/16/2014. This nodule appears relatively dense, and may in fact represent a calcified granuloma, however, lack of visualization on the prior study is unusual. No acute consolidative airspace disease. No pleural effusions. No evidence of pulmonary edema. Bilateral apical pleural parenchymal thickening, similar to the prior study, most compatible with chronic post infectious or inflammatory scarring. Heart size is normal. Moderate to large hiatal hernia. Upper mediastinal contours are within normal limits. Atherosclerosis in the thoracic aorta. IMPRESSION: 1. No radiographic evidence of acute cardiopulmonary disease. 2. **An incidental finding of potential clinical significance has been found. New 12 mm nodule projecting over the anterior aspect of the lower lobes visualized only on the lateral projection, new compared to prior study 08/16/2014. Follow-up nonemergent chest CT is recommended in the near future to better evaluate this finding.** 3. Aortic atherosclerosis. 4. Moderate to large hiatal hernia. Electronically Signed   By: Trudie Reed M.D.   On: 11/11/2015 14:16    Procedures Procedures (including critical care time)  Medications Ordered in ED Medications  levalbuterol (XOPENEX HFA) inhaler 2 puff (2 puffs Inhalation Given 11/11/15 1437)  levalbuterol (XOPENEX) nebulizer solution 0.63 mg (0.63 mg Nebulization Given 11/11/15 1437)     Initial Impression / Assessment and Plan / ED Course  I have reviewed the triage vital signs and the nursing notes.  Pertinent labs & imaging results that were available during my care  of the patient were reviewed by me and considered in my medical decision making (see chart for details).  Clinical Course    Patient continues to feel better this time.  She was given a Xopenex inhaler here and educated on how to use it and when to use it by both myself and the respiratory therapist.  Patient family are aware of the new 12 mm nodule which will need follow-up CT imaging.  No indication for additional imaging at this time.  Overall well-appearing.  Primary care and pulmonary follow-up.  No indication for additional workup.  Final Clinical Impressions(s) / ED Diagnoses   Final diagnoses:  Asthma, unspecified asthma severity, uncomplicated    New Prescriptions Current Discharge Medication List       Azalia Bilis, MD 11/11/15 1449    Azalia Bilis, MD 12/14/15 [phone]

## 2015-11-27 ENCOUNTER — Encounter: Payer: Self-pay | Admitting: Emergency Medicine

## 2015-11-27 ENCOUNTER — Ambulatory Visit: Payer: Medicare Other | Admitting: Emergency Medicine

## 2015-11-27 ENCOUNTER — Ambulatory Visit (INDEPENDENT_AMBULATORY_CARE_PROVIDER_SITE_OTHER): Payer: Medicare Other | Admitting: Emergency Medicine

## 2015-11-27 DIAGNOSIS — J4521 Mild intermittent asthma with (acute) exacerbation: Secondary | ICD-10-CM | POA: Diagnosis not present

## 2015-11-27 DIAGNOSIS — R911 Solitary pulmonary nodule: Secondary | ICD-10-CM | POA: Insufficient documentation

## 2015-11-27 DIAGNOSIS — Z23 Encounter for immunization: Secondary | ICD-10-CM | POA: Diagnosis not present

## 2015-11-27 DIAGNOSIS — J301 Allergic rhinitis due to pollen: Secondary | ICD-10-CM

## 2015-11-27 NOTE — Patient Instructions (Addendum)
Please work hard to remember to take your symbicort 2 puffs twice a day  Keep your xopenex available to use 2 puffs if needed for shortness of breath.  Continue your loratadine (Claritin) 10mg  daily Flu shot today  We will repeat your CXR next visit.  Follow with Dr Delton CoombesByrum in 6 months or sooner if you have any problems

## 2015-11-27 NOTE — Assessment & Plan Note (Signed)
Continue loratadine 

## 2015-11-27 NOTE — Assessment & Plan Note (Signed)
Her dyspnea, again it's difficult to tease out whether this is upper airway in nature versus true asthma. No wheezing is been reported. She is not wheezing on exam today. I encouraged her to use her Symbicort as prescribed because she has forgotten to use it. Also encouraged her to use xopenex as needed. Flu shot today.

## 2015-11-27 NOTE — Assessment & Plan Note (Signed)
Noted on her chest x-ray from 11/11/15. Discussed this with her and with her daughter. Given her age and the small size, a history of granulomatous disease, I believe that a conservative approach here is appropriate. Not clear to me that she will tolerate biopsy or invasive management. We will recheck a chest x-ray in 6 months. Decide at that time whether a CT scan of the chest is warranted.

## 2015-11-27 NOTE — Progress Notes (Signed)
Subjective:    Patient ID: Meagan Osborn, female    DOB: [birth year], 82 y.o.   MRN: [id number]  Asthma  Her past medical history is significant for asthma.   82 yo female never smoker with long hx of asthma seen for pulmonary consult April 2016 with Dr. Delton CoombesByrum    Follow up : Asthma 08/16/14 Pt returns for 2 month follow up for asthma  Seen last ov with increased asthma and allergic rhinitis symptoms .  She was changed from Flovent to Symbicort.  Pt says she tried the 2 week sample but thinks it made her feel bad or could not tolerate. Does not remember the exact reason.  Previously on advair but did not tolerate in past.  Continues to have sinus congestion and fullness.  Gets winded on/off and tightness.  Does not use  albuterol , is expired. Says it makes her nervous.  Is sensitive to medications.  No chest pain, syncope, hemoptysis, fever, cough, orthopnea or edema.  Spirometry today shows FEV1 60%, ratio 54. Decreased mid flows at 40%. She has cats but does allow in BR. Has had allergy eval in past.  Can tolerate children claritin.  Does not tolerate steroids in past.   ROV 09/26/14 -- follow-up visit for asthma. We tried stopping Advair and starting Symbicort at my last visit with her. She did not tolerate this so at her last visit it was recommended that she start half dose, 2 puffs qd. She was also started on nasal saline and loratadine. Her breathing has been better, daughter is assisting with hx and agrees. She has freq sneezing and congestion.   ROV 04/04/15 --  Follow up visit for asthma and chronic obstructive disease. At our last visit we changed her to Symbicort 80/4.5, 2 puffs once a day. She did well thru the Fall, no flares.  She tells me that she believe that her head feels funny, a little foggy. Her daughter has noticed some increased dyspnea, happens once every few weeks. Difficult to tease out whether this is anxiety, UA sx. She has not taken xopenex at these times.    ROV 11/27/15 -- pt has a hx of asthma and COPD, also with hx allergies. She feels that she has been doing well overall. She did have an episode in September when she felt more dyspneic, seen in ED 9/30. Better with nebs. Unclear whether she has wheeze. Daughter does notice some change in her voice. Her daughter notes that she hasn't been using her xopenex prn. She has not been on Symbicort reliably.  She is now trying to take bid on a schedule.  Of note, a new 12mm pulmonary nodule was found on her CXR from the ED 9/30.     Review of Systems As per HPI    Objective:   Physical Exam Vitals:   11/27/15 1419  BP: 122/80  Pulse: 86  SpO2: 96%  Weight: 52.2 kg (115 lb)    GEN: A/Ox3; pleasant , NAD, frail and elderly    HEENT:  [place]/AT,  EACs-clear, TMs-wnl, NOSE-clear drainage , THROAT-clear, no lesions, no postnasal drip or exudate noted.   NECK:  No stridor.   RESP  Clear  P & A; w/o, wheezes/ rales/ or rhonchi. no accessory muscle use, no dullness to percussion  CARD:  RRR, no m/r/g  , no peripheral edema, pulses intact, no cyanosis or club  Musco: Warm bil, no deformities or joint swelling noted.   Neuro: alert, no focal deficits  noted.    Skin: Warm, no lesions or rashes      Assessment & Plan:  Pulmonary nodule Noted on her chest x-ray from 11/11/15. Discussed this with her and with her daughter. Given her age and the small size, a history of granulomatous disease, I believe that a conservative approach here is appropriate. Not clear to me that she will tolerate biopsy or invasive management. We will recheck a chest x-ray in 6 months. Decide at that time whether a CT scan of the chest is warranted.  Asthma, chronic Her dyspnea, again it's difficult to tease out whether this is upper airway in nature versus true asthma. No wheezing is been reported. She is not wheezing on exam today. I encouraged her to use her Symbicort as prescribed because she has forgotten to use it.  Also encouraged her to use xopenex as needed. Flu shot today.   Allergic rhinitis Continue loratadine  Levy Pupa, MD, PhD 11/27/2015, 3:13 PM [facility] [phone] or if no answer [phone]

## 2015-12-01 ENCOUNTER — Encounter: Payer: Self-pay | Admitting: Adult Health

## 2015-12-01 ENCOUNTER — Ambulatory Visit (INDEPENDENT_AMBULATORY_CARE_PROVIDER_SITE_OTHER): Payer: Medicare Other | Admitting: Adult Health

## 2015-12-01 DIAGNOSIS — J302 Other seasonal allergic rhinitis: Secondary | ICD-10-CM | POA: Diagnosis not present

## 2015-12-01 MED ORDER — AZITHROMYCIN 250 MG PO TABS: ORAL_TABLET | ORAL | 0 refills | Status: AC

## 2015-12-01 NOTE — Assessment & Plan Note (Signed)
AR/URI flare   Plan  Patient Instructions  Mucinex DM Twice daily  As needed  Cough/congestion  Claritin 10mg  daily As needed  Drainage.  Saline nasal rinses As needed   Zpack to have on hold if symptoms worsen or do not improve with discolored mucus.  Follow up Dr. Delton CoombesByrum  As planned and As needed   Please contact office for sooner follow up if symptoms do not improve or worsen or seek emergency care

## 2015-12-01 NOTE — Progress Notes (Signed)
Subjective:    Patient ID: Meagan Osborn, female    DOB: Dec 20, 1920, 82 y.o.   MRN: [id number]  HPI 82 yo female never smoker with long hx of asthma seen for pulmonary consult April 2016 with Dr. Delton Coombes    12/01/2015 Acute OV  : Asthma  Patient presents for an acute office visit. Patient completed the last 3-4 days. She's had increased cough, congestion, sneezing. Mucus has been mainly clear. She denies any hemoptysis, orthopnea, PND, or increased leg swelling. She is not taking any medications for treatment. He is accompanied by her daughter and son-in-law. She remains on Symbicort twice daily.  Says that her appetite is good with no nausea, vomiting or diarrhea.    Past Medical History:  Diagnosis Date  . Anxiety   . Arthritis   . Asthma   . Hypertension   . Hypothyroidism   . Macular degeneration   . Osteoporosis   . Thyroid disease    Current Outpatient Prescriptions on File Prior to Visit  Medication Sig Dispense Refill  . amLODipine (NORVASC) 2.5 MG tablet Take 2.5 mg by mouth 2 (two) times daily.   5  . levalbuterol (XOPENEX HFA) 45 MCG/ACT inhaler Inhale 2 puffs into the lungs every 4 (four) hours as needed for wheezing or shortness of breath. 1 Inhaler 1  . levothyroxine (SYNTHROID, LEVOTHROID) 25 MCG tablet Take 25 mcg by mouth daily before breakfast.    . loratadine (CLARITIN) 10 MG tablet Take 10 mg by mouth daily.    . SYMBICORT 80-4.5 MCG/ACT inhaler INHALE 2 PUFFS INTO THE LUNGS TWICE A DAY 10.2 Inhaler 5  . telmisartan-hydrochlorothiazide (MICARDIS HCT) 80-25 MG per tablet Take 1 tablet by mouth daily.     No current facility-administered medications on file prior to visit.           Review of Systems Constitutional:   No  weight loss, night sweats,  Fevers, chills, fatigue, or  lassitude.  HEENT:   No headaches,  Difficulty swallowing,  Tooth/dental problems, or  Sore throat,                No sneezing, itching, ear ache, + nasal congestion, post  nasal drip,   CV:  No chest pain,  Orthopnea, PND, swelling in lower extremities, anasarca, dizziness, palpitations, syncope.   GI  No heartburn, indigestion, abdominal pain, nausea, vomiting, diarrhea, change in bowel habits, loss of appetite, bloody stools.   Resp:    No excess mucus, no productive cough,  No non-productive cough,  No coughing up of blood.  No change in color of mucus.  Occasional wheezing.  No chest wall deformity  Skin: no rash or lesions.  GU: no dysuria, change in color of urine, no urgency or frequency.  No flank pain, no hematuria   MS:  No joint pain or swelling.  No decreased range of motion.  No back pain.  Psych:  No change in mood or affect. No depression or anxiety.  No memory loss.         Objective:   Physical Exam Vitals:   12/01/15 1436  BP: 136/64  Pulse: 90  Temp: 97.9 F (36.6 C)  TempSrc: Oral  SpO2: 98%  Weight: 115 lb (52.2 kg)  Height: 5' (1.524 m)    GEN: A/Ox3; pleasant , NAD, frail and elderly    HEENT:  [place]/AT,  EACs-clear, TMs-wnl, NOSE-clear drainage , THROAT-clear, no lesions, no postnasal drip or exudate noted.   NECK:  Supple  w/ fair ROM; no JVD; normal carotid impulses w/o bruits; no thyromegaly or nodules palpated; no lymphadenopathy.    RESP  Clear  P & A; w/o, wheezes/ rales/ or rhonchi. no accessory muscle use, no dullness to percussion  CARD:  RRR, no m/r/g  , no peripheral edema, pulses intact, no cyanosis or clubbing.  GI:   Soft & nt; nml bowel sounds; no organomegaly or masses detected.   Musco: Warm bil, no deformities or joint swelling noted.   Neuro: alert, no focal deficits noted.    Skin: Warm, no lesions or rashes   Tammy Parrett NP-C  [facility]  12/01/2015

## 2015-12-01 NOTE — Patient Instructions (Signed)
Mucinex DM Twice daily  As needed  Cough/congestion  Claritin 10mg  daily As needed  Drainage.  Saline nasal rinses As needed   Zpack to have on hold if symptoms worsen or do not improve with discolored mucus.  Follow up Dr. Delton CoombesByrum  As planned and As needed   Please contact office for sooner follow up if symptoms do not improve or worsen or seek emergency care

## 2016-04-05 ENCOUNTER — Other Ambulatory Visit: Payer: Self-pay | Admitting: Emergency Medicine

## 2016-04-13 ENCOUNTER — Other Ambulatory Visit: Payer: Self-pay | Admitting: Emergency Medicine

## 2016-06-07 ENCOUNTER — Ambulatory Visit: Payer: Medicare Other | Admitting: Emergency Medicine

## 2016-06-13 ENCOUNTER — Ambulatory Visit (INDEPENDENT_AMBULATORY_CARE_PROVIDER_SITE_OTHER): Payer: Medicare Other | Admitting: Emergency Medicine

## 2016-06-13 ENCOUNTER — Ambulatory Visit (INDEPENDENT_AMBULATORY_CARE_PROVIDER_SITE_OTHER)
Admission: RE | Admit: 2016-06-13 | Discharge: 2016-06-13 | Disposition: A | Discharge status: Home / Self Care | Payer: Medicare Other | Source: Ambulatory Visit | Attending: Emergency Medicine | Admitting: Emergency Medicine

## 2016-06-13 ENCOUNTER — Encounter: Payer: Self-pay | Admitting: Emergency Medicine

## 2016-06-13 DIAGNOSIS — J302 Other seasonal allergic rhinitis: Secondary | ICD-10-CM | POA: Diagnosis not present

## 2016-06-13 DIAGNOSIS — R911 Solitary pulmonary nodule: Secondary | ICD-10-CM

## 2016-06-13 DIAGNOSIS — J4521 Mild intermittent asthma with (acute) exacerbation: Secondary | ICD-10-CM | POA: Diagnosis not present

## 2016-06-13 NOTE — Addendum Note (Signed)
Addended by: Cydney OkAUGUSTIN, Kashayla Ungerer N on: 06/13/2016 03:01 PM   Modules accepted: Orders

## 2016-06-13 NOTE — Assessment & Plan Note (Signed)
Please continue your loratadine 10mg  daily

## 2016-06-13 NOTE — Progress Notes (Signed)
Subjective:    Patient ID: Meagan Osborn, female    DOB: 1920-12-24, 83 y.o.   MRN: [id number]  Asthma  Her past medical history is significant for asthma.   83 yo female never smoker with long hx of asthma seen for pulmonary consult April 2016 with Dr. Delton CoombesByrum    Follow up : Asthma 08/16/14 Pt returns for 2 month follow up for asthma  Seen last ov with increased asthma and allergic rhinitis symptoms .  She was changed from Flovent to Symbicort.  Pt says she tried the 2 week sample but thinks it made her feel bad or could not tolerate. Does not remember the exact reason.  Previously on advair but did not tolerate in past.  Continues to have sinus congestion and fullness.  Gets winded on/off and tightness.  Does not use  albuterol , is expired. Says it makes her nervous.  Is sensitive to medications.  No chest pain, syncope, hemoptysis, fever, cough, orthopnea or edema.  Spirometry today shows FEV1 60%, ratio 54. Decreased mid flows at 40%. She has cats but does allow in BR. Has had allergy eval in past.  Can tolerate children claritin.  Does not tolerate steroids in past.   ROV 09/26/14 -- follow-up visit for asthma. We tried stopping Advair and starting Symbicort at my last visit with her. She did not tolerate this so at her last visit it was recommended that she start half dose, 2 puffs qd. She was also started on nasal saline and loratadine. Her breathing has been better, daughter is assisting with hx and agrees. She has freq sneezing and congestion.   ROV 04/04/15 --  Follow up visit for asthma and chronic obstructive disease. At our last visit we changed her to Symbicort 80/4.5, 2 puffs once a day. She did well thru the Fall, no flares.  She tells me that she believe that her head feels funny, a little foggy. Her daughter has noticed some increased dyspnea, happens once every few weeks. Difficult to tease out whether this is anxiety, UA sx. She has not taken xopenex at these times.    ROV 11/27/15 -- pt has a hx of asthma and COPD, also with hx allergies. She feels that she has been doing well overall. She did have an episode in September when she felt more dyspneic, seen in ED 9/30. Better with nebs. Unclear whether she has wheeze. Daughter does notice some change in her voice. Her daughter notes that she hasn't been using her xopenex prn. She has not been on Symbicort reliably.  She is now trying to take bid on a schedule.  Of note, a new 12mm pulmonary nodule was found on her CXR from the ED 11/11/15.   ROV 06/13/16 -- this follow-up visit for patient with a history of asthma/fixed obstruction, also with allergic rhinitis and cough. She was found to have a suspected lower lobe pulmonary nodule on the lateral chest x-ray from 11/11/15. No subsequent CT scan was done, plans to follow with a chest x-ray.  She has done fairly well since last visit - no significant increase in drainage, taking loratadine reliably. She is on symbicort bid, sometimes skips it. She never uses her xopenex She has lost some wt, about 2-3 lbs over the last couple of weeks.    Review of Systems As per HPI    Objective:   Physical Exam Vitals:   06/13/16 1427  BP: 112/60  Pulse: 80  SpO2: 97%  Weight: 113  lb (51.3 kg)  Height: 5' (1.524 m)   Gen: Pleasant, Elderly woman, well-nourished, in no distress,  normal affect  ENT: No lesions,  mouth clear,  oropharynx clear, no postnasal drip  Neck: No JVD, no stridor  Lungs: No use of accessory muscles, clear without rales or rhonchi  Cardiovascular: RRR, heart sounds normal, no murmur or gallops, no peripheral edema  Musculoskeletal: No deformities, no cyanosis or clubbing  Neuro: alert, non focal  Skin: Warm, no lesions or rashes     Assessment & Plan:  Asthma, chronic Please continue your Symbicort twice a day  Keep your xopenex available to use 2 puffs if needed for shortness of breath.  Follow with Dr Delton Coombes in 6 months or sooner if you  have any problems  Allergic rhinitis Please continue your loratadine 10mg  daily  Pulmonary nodule We will repeat your CXR today  Levy Pupa, MD, PhD 06/13/2016, 2:55 PM [facility] [phone] or if no answer [phone]

## 2016-06-13 NOTE — Assessment & Plan Note (Signed)
Please continue your Symbicort twice a day  Keep your xopenex available to use 2 puffs if needed for shortness of breath.  Follow with Dr Delton CoombesByrum in 6 months or sooner if you have any problems

## 2016-06-13 NOTE — Assessment & Plan Note (Signed)
We will repeat your CXR today

## 2016-06-13 NOTE — Patient Instructions (Signed)
Please continue your Symbicort twice a day  Please continue your loratadine 10mg  daily Keep your xopenex available to use 2 puffs if needed for shortness of breath.  We will repeat your CXR today.  Follow with Dr Delton CoombesByrum in 6 months or sooner if you have any problems

## 2016-08-04 ENCOUNTER — Other Ambulatory Visit: Payer: Self-pay | Admitting: Emergency Medicine

## 2016-10-02 ENCOUNTER — Telehealth: Payer: Self-pay | Admitting: Emergency Medicine

## 2016-10-02 NOTE — Telephone Encounter (Signed)
I agree with taking the symbicort bid on a schedule as first step. If she doesn't want to take loratadine, an alternative would be to take fluticasone nasal spray once a day  If she has another episode I believe she should come to be evaluated. I do agree with using xopenex to see if it helps, but she should only use 2 puffs q4h prn, not more freq than that

## 2016-10-02 NOTE — Telephone Encounter (Signed)
Darl Pikes returned call, CB [phone]

## 2016-10-02 NOTE — Telephone Encounter (Signed)
Left message for Meagan Osborn to call back

## 2016-10-02 NOTE — Telephone Encounter (Signed)
Called spoke with patient's daughter Darl Pikes who reported that patient had an episode of dyspnea last night that lasted approx 3 hours - pt was sitting on the front porch last evening to eat dinner.  Pt used her Xopenex HFA 2puffs every 15 minutes x2 with no relief.  Patient did ask Darl Pikes for a warm wet washcloth to be laid over her face and this did offer some relief.  Per Darl Pikes pt did not experience any wheezing, tightness in her chest, cough or congestion.  Darl Pikes described the episode as the patient had difficult bringing air in with shallow breaths.  Darl Pikes reports patient does not need an appt at this time but is instead asking for recommendations on what she can do if/when this happens again.  Pt's last episode was approximately 1 month ago.  Pt does not take her Symbicort twice daily, sometimes not even once daily.  Pt was recommended by [facility] to take Claritin every day, but read in a newspaper column that the elderly should not take this and decided to stop.  Darl Pikes is aware that pt should take her daily medications as prescribed.  Dr Delton Coombes please advise, thank you.

## 2016-10-02 NOTE — Telephone Encounter (Signed)
Pt's daughter, Darl Pikes is aware of RB's recommendations and voiced her understanding. Nothing further needed.

## 2016-12-10 ENCOUNTER — Ambulatory Visit: Payer: Medicare Other | Admitting: Emergency Medicine

## 2016-12-13 ENCOUNTER — Encounter: Payer: Self-pay | Admitting: Emergency Medicine

## 2016-12-13 ENCOUNTER — Ambulatory Visit (INDEPENDENT_AMBULATORY_CARE_PROVIDER_SITE_OTHER): Payer: Medicare Other | Admitting: Emergency Medicine

## 2016-12-13 DIAGNOSIS — R1310 Dysphagia, unspecified: Secondary | ICD-10-CM | POA: Diagnosis not present

## 2016-12-13 DIAGNOSIS — Z23 Encounter for immunization: Secondary | ICD-10-CM | POA: Diagnosis not present

## 2016-12-13 NOTE — Progress Notes (Signed)
   Subjective:    Patient ID: Meagan Osborn, female    DOB: [birth year], 83 y.o.   MRN: [id number]  Asthma  Her past medical history is significant for asthma.   [age] yo female never smoker with long hx of asthma seen for pulmonary consult April 2016 with Dr. Delton CoombesByrum    ROV 06/13/16 -- this follow-up visit for patient with a history of asthma/fixed obstruction, also with allergic rhinitis and cough. She was found to have a suspected lower lobe pulmonary nodule on the lateral chest x-ray from 11/11/15. No subsequent CT scan was done, plans to follow with a chest x-ray.  She has done fairly well since last visit - no significant increase in drainage, taking loratadine reliably. She is on symbicort bid, sometimes skips it. She never uses her xopenex She has lost some wt, about 2-3 lbs over the last couple of weeks.   ROV 12/13/16 --patient has a history of fixed asthma, allergic rhinitis, chronic cough.  Also with scattered calcified granulomas on chest x-ray.  CT scan has not been done to follow these nodules.  She has been managed on Symbicort, Xopenex as needed.  She tells me that she believes that she has slowed down some, is walking a bit less. She is using symbicort, often misses a dose.  Her daughter describes delayed swallowing, holding food medication drink in her mouth before swallowing, no overt choking or coughing   Review of Systems As per HPI    Objective:   Physical Exam Vitals:   12/13/16 1413  BP: 128/78  Pulse: 91  SpO2: 100%  Weight: 116 lb (52.6 kg)  Height: 4\' 10"  (1.473 m)   Gen: Pleasant, Elderly woman, well-nourished, in no distress,  normal affect  ENT: No lesions,  mouth clear,  oropharynx clear, no postnasal drip  Neck: No JVD, no stridor  Lungs: No use of accessory muscles, clear without rales or rhonchi  Cardiovascular: RRR, heart sounds normal, no murmur or gallops, no peripheral edema  Musculoskeletal: No deformities, no cyanosis or clubbing  Neuro: alert,  non focal  Skin: Warm, no lesions or rashes     Assessment & Plan:  Swallowing impaired Delayed swallowing and some pooling of medication, drink in her mouth before she will swallow.  At some risk for choking although none overtly reported.  I believe she would benefit from a speech therapy evaluation, aspiration precautions per their recommendations.  Pulmonary nodule Following with intermittent chest x-rays.  Discussed timing next visit  Asthma, chronic She has not been taking Symbicort reliably.  Her Xopenex use has increased.  We discussed taking the Symbicort reliably twice a day.  She will work on this.  Levy Pupaobert Byrum, MD, PhD 12/13/2016, 2:30 PM [facility] [phone] or if no answer [phone]

## 2016-12-13 NOTE — Patient Instructions (Addendum)
Please continue your Symbicort. Start using it twice a day reliably.  Keep xopenex available to use 2 puffs as needed for shortness of breath.  Restart your loratadine 10mg  daily.  Flu shot today We will refer you to speech therapy to discuss your swallowing difficulties, possible techniques to avoid getting choked when eating and drinking. Follow with Dr Delton CoombesByrum in 6 months or sooner if you have any problems

## 2016-12-13 NOTE — Assessment & Plan Note (Signed)
Following with intermittent chest x-rays.  Discussed timing next visit

## 2016-12-13 NOTE — Assessment & Plan Note (Signed)
She has not been taking Symbicort reliably.  Her Xopenex use has increased.  We discussed taking the Symbicort reliably twice a day.  She will work on this.

## 2016-12-13 NOTE — Assessment & Plan Note (Signed)
Delayed swallowing and some pooling of medication, drink in her mouth before she will swallow.  At some risk for choking although none overtly reported.  I believe she would benefit from a speech therapy evaluation, aspiration precautions per their recommendations.

## 2017-01-28 ENCOUNTER — Other Ambulatory Visit: Payer: Self-pay | Admitting: Emergency Medicine

## 2017-01-28 DIAGNOSIS — R1310 Dysphagia, unspecified: Secondary | ICD-10-CM

## 2017-01-30 ENCOUNTER — Other Ambulatory Visit (HOSPITAL_COMMUNITY): Payer: Self-pay | Admitting: Emergency Medicine

## 2017-01-30 DIAGNOSIS — R1310 Dysphagia, unspecified: Secondary | ICD-10-CM

## 2017-02-17 ENCOUNTER — Telehealth: Payer: Self-pay | Admitting: Emergency Medicine

## 2017-02-17 NOTE — Telephone Encounter (Signed)
Called and spoke with pt's daughter [name] who is DPR on file who stated pt was needing a Rx of Xopenex HFA.  Last time ned was filled for pt was done by Azalia BilisKevin Campos, MD and [name] stated she thought that was done last time pt was at the hospital.  Dr. Delton CoombesByrum, please advise if you are okay for us to send a Rx of Xopenex HFA in to pt's pharmacy. Thanks!

## 2017-02-18 ENCOUNTER — Ambulatory Visit (HOSPITAL_COMMUNITY)
Admission: RE | Admit: 2017-02-18 | Discharge: 2017-02-18 | Disposition: A | Discharge status: Home / Self Care | Payer: Medicare Other | Source: Ambulatory Visit | Attending: Emergency Medicine | Admitting: Emergency Medicine

## 2017-02-18 DIAGNOSIS — R1310 Dysphagia, unspecified: Secondary | ICD-10-CM

## 2017-02-18 MED ORDER — LEVALBUTEROL TARTRATE 45 MCG/ACT IN AERO: 2.0000 | INHALATION_SPRAY | RESPIRATORY_TRACT | 4 refills | Status: DC | PRN

## 2017-02-18 MED ORDER — LEVALBUTEROL TARTRATE 45 MCG/ACT IN AERO: 2.0000 | INHALATION_SPRAY | RESPIRATORY_TRACT | 4 refills | Status: AC | PRN

## 2017-02-18 NOTE — Telephone Encounter (Signed)
Spoke with pt's daughter, Meagan Osborn in office.  Meagan Osborn requested xopenex refills.  Rx for Xopenex has been sent to preferred pharmacy.  Nothing further is needed.

## 2017-02-18 NOTE — Telephone Encounter (Signed)
Daughter Darl Pikes(Susan) is waiting in lobby; wanting to get Xopenex for patient.Marland Kitchen..Marland Kitchen

## 2017-03-19 ENCOUNTER — Ambulatory Visit: Payer: Medicare Other

## 2017-04-15 ENCOUNTER — Ambulatory Visit: Payer: Medicare Other

## 2017-05-06 ENCOUNTER — Telehealth: Payer: Self-pay | Admitting: Emergency Medicine

## 2017-05-06 NOTE — Telephone Encounter (Signed)
Spoke with pt's daughter, Meagan Osborn. She was calling to let us know that the pt had her barium swallow test. They were told it was normal after the test was done. Meagan Osborn is going to cancel the speech therapy appointment the pt has due to the swallowing test being normal. Nothing further was needed at this time.

## 2017-05-08 ENCOUNTER — Encounter: Payer: Medicare Other | Admitting: Speech Pathology

## 2017-05-23 ENCOUNTER — Emergency Department (HOSPITAL_COMMUNITY): Payer: Medicare Other

## 2017-05-23 ENCOUNTER — Ambulatory Visit: Payer: Medicare Other | Admitting: Pulmonary Disease

## 2017-05-23 ENCOUNTER — Inpatient Hospital Stay (HOSPITAL_COMMUNITY)
Admission: EM | Admit: 2017-05-23 | Discharge: 2017-06-03 | DRG: 492 | Disposition: A | Discharge status: Skilled Nursing Facility | Payer: Medicare Other | Attending: Internal Medicine | Admitting: Internal Medicine

## 2017-05-23 ENCOUNTER — Encounter (HOSPITAL_COMMUNITY): Payer: Self-pay | Admitting: Emergency Medicine

## 2017-05-23 ENCOUNTER — Other Ambulatory Visit: Payer: Self-pay

## 2017-05-23 DIAGNOSIS — Z419 Encounter for procedure for purposes other than remedying health state, unspecified: Secondary | ICD-10-CM

## 2017-05-23 DIAGNOSIS — I080 Rheumatic disorders of both mitral and aortic valves: Secondary | ICD-10-CM | POA: Diagnosis present

## 2017-05-23 DIAGNOSIS — Z7189 Other specified counseling: Secondary | ICD-10-CM

## 2017-05-23 DIAGNOSIS — R011 Cardiac murmur, unspecified: Secondary | ICD-10-CM | POA: Diagnosis present

## 2017-05-23 DIAGNOSIS — R1310 Dysphagia, unspecified: Secondary | ICD-10-CM

## 2017-05-23 DIAGNOSIS — S32592A Other specified fracture of left pubis, initial encounter for closed fracture: Secondary | ICD-10-CM

## 2017-05-23 DIAGNOSIS — H9190 Unspecified hearing loss, unspecified ear: Secondary | ICD-10-CM | POA: Diagnosis present

## 2017-05-23 DIAGNOSIS — S42202A Unspecified fracture of upper end of left humerus, initial encounter for closed fracture: Secondary | ICD-10-CM | POA: Diagnosis not present

## 2017-05-23 DIAGNOSIS — N184 Chronic kidney disease, stage 4 (severe): Secondary | ICD-10-CM | POA: Diagnosis present

## 2017-05-23 DIAGNOSIS — E039 Hypothyroidism, unspecified: Secondary | ICD-10-CM | POA: Diagnosis present

## 2017-05-23 DIAGNOSIS — M810 Age-related osteoporosis without current pathological fracture: Secondary | ICD-10-CM | POA: Diagnosis present

## 2017-05-23 DIAGNOSIS — Y92000 Kitchen of unspecified non-institutional (private) residence as  the place of occurrence of the external cause: Secondary | ICD-10-CM

## 2017-05-23 DIAGNOSIS — J069 Acute upper respiratory infection, unspecified: Secondary | ICD-10-CM | POA: Diagnosis present

## 2017-05-23 DIAGNOSIS — I130 Hypertensive heart and chronic kidney disease with heart failure and stage 1 through stage 4 chronic kidney disease, or unspecified chronic kidney disease: Secondary | ICD-10-CM | POA: Diagnosis present

## 2017-05-23 DIAGNOSIS — I5033 Acute on chronic diastolic (congestive) heart failure: Secondary | ICD-10-CM | POA: Diagnosis present

## 2017-05-23 DIAGNOSIS — G309 Alzheimer's disease, unspecified: Secondary | ICD-10-CM | POA: Diagnosis present

## 2017-05-23 DIAGNOSIS — Z9049 Acquired absence of other specified parts of digestive tract: Secondary | ICD-10-CM

## 2017-05-23 DIAGNOSIS — I951 Orthostatic hypotension: Secondary | ICD-10-CM | POA: Diagnosis present

## 2017-05-23 DIAGNOSIS — J45909 Unspecified asthma, uncomplicated: Secondary | ICD-10-CM | POA: Diagnosis present

## 2017-05-23 DIAGNOSIS — H3530 Unspecified macular degeneration: Secondary | ICD-10-CM | POA: Diagnosis present

## 2017-05-23 DIAGNOSIS — M4312 Spondylolisthesis, cervical region: Secondary | ICD-10-CM | POA: Diagnosis present

## 2017-05-23 DIAGNOSIS — S0003XA Contusion of scalp, initial encounter: Secondary | ICD-10-CM | POA: Diagnosis present

## 2017-05-23 DIAGNOSIS — R1110 Vomiting, unspecified: Secondary | ICD-10-CM

## 2017-05-23 DIAGNOSIS — D62 Acute posthemorrhagic anemia: Secondary | ICD-10-CM | POA: Diagnosis not present

## 2017-05-23 DIAGNOSIS — E876 Hypokalemia: Secondary | ICD-10-CM

## 2017-05-23 DIAGNOSIS — I2729 Other secondary pulmonary hypertension: Secondary | ICD-10-CM | POA: Diagnosis present

## 2017-05-23 DIAGNOSIS — Z79899 Other long term (current) drug therapy: Secondary | ICD-10-CM

## 2017-05-23 DIAGNOSIS — D72828 Other elevated white blood cell count: Secondary | ICD-10-CM | POA: Diagnosis present

## 2017-05-23 DIAGNOSIS — F0280 Dementia in other diseases classified elsewhere without behavioral disturbance: Secondary | ICD-10-CM | POA: Diagnosis present

## 2017-05-23 DIAGNOSIS — Z515 Encounter for palliative care: Secondary | ICD-10-CM | POA: Diagnosis not present

## 2017-05-23 DIAGNOSIS — Z7951 Long term (current) use of inhaled steroids: Secondary | ICD-10-CM

## 2017-05-23 DIAGNOSIS — S329XXA Fracture of unspecified parts of lumbosacral spine and pelvis, initial encounter for closed fracture: Secondary | ICD-10-CM | POA: Diagnosis present

## 2017-05-23 DIAGNOSIS — R0600 Dyspnea, unspecified: Secondary | ICD-10-CM

## 2017-05-23 DIAGNOSIS — J9601 Acute respiratory failure with hypoxia: Secondary | ICD-10-CM

## 2017-05-23 DIAGNOSIS — Z79890 Hormone replacement therapy (postmenopausal): Secondary | ICD-10-CM

## 2017-05-23 DIAGNOSIS — W19XXXA Unspecified fall, initial encounter: Secondary | ICD-10-CM

## 2017-05-23 DIAGNOSIS — S42209A Unspecified fracture of upper end of unspecified humerus, initial encounter for closed fracture: Secondary | ICD-10-CM

## 2017-05-23 DIAGNOSIS — N179 Acute kidney failure, unspecified: Secondary | ICD-10-CM | POA: Diagnosis not present

## 2017-05-23 DIAGNOSIS — Z66 Do not resuscitate: Secondary | ICD-10-CM | POA: Diagnosis not present

## 2017-05-23 DIAGNOSIS — W1830XA Fall on same level, unspecified, initial encounter: Secondary | ICD-10-CM

## 2017-05-23 DIAGNOSIS — S2232XA Fracture of one rib, left side, initial encounter for closed fracture: Secondary | ICD-10-CM

## 2017-05-23 DIAGNOSIS — F419 Anxiety disorder, unspecified: Secondary | ICD-10-CM | POA: Diagnosis present

## 2017-05-23 DIAGNOSIS — E8770 Fluid overload, unspecified: Secondary | ICD-10-CM

## 2017-05-23 HISTORY — DX: Other specified fracture of left pubis, initial encounter for closed fracture: S32.592A

## 2017-05-23 HISTORY — DX: Fracture of one rib, left side, initial encounter for closed fracture: S22.32XA

## 2017-05-23 HISTORY — DX: Unspecified fracture of upper end of left humerus, initial encounter for closed fracture: S42.202A

## 2017-05-23 LAB — HEPATIC FUNCTION PANEL
ALT: 18 U/L (ref 14–54)
AST: 19 U/L (ref 15–41)
Albumin: 3.3 g/dL — ABNORMAL LOW (ref 3.5–5.0)
Alkaline Phosphatase: 52 U/L (ref 38–126)
BILIRUBIN INDIRECT: 0.5 mg/dL (ref 0.3–0.9)
Bilirubin, Direct: 0.1 mg/dL (ref 0.1–0.5)
TOTAL PROTEIN: 6.3 g/dL — AB (ref 6.5–8.1)
Total Bilirubin: 0.6 mg/dL (ref 0.3–1.2)

## 2017-05-23 LAB — CBC WITH DIFFERENTIAL/PLATELET
BASOS PCT: 0 %
Basophils Absolute: 0 10*3/uL (ref 0.0–0.1)
EOS PCT: 1 %
Eosinophils Absolute: 0.1 10*3/uL (ref 0.0–0.7)
HEMATOCRIT: 30.6 % — AB (ref 36.0–46.0)
Hemoglobin: 9.9 g/dL — ABNORMAL LOW (ref 12.0–15.0)
LYMPHS PCT: 12 %
Lymphs Abs: 1.7 10*3/uL (ref 0.7–4.0)
MCH: 31.1 pg (ref 26.0–34.0)
MCHC: 32.4 g/dL (ref 30.0–36.0)
MCV: 96.2 fL (ref 78.0–100.0)
MONOS PCT: 7 %
Monocytes Absolute: 1 10*3/uL (ref 0.1–1.0)
NEUTROS PCT: 80 %
Neutro Abs: 11.6 10*3/uL — ABNORMAL HIGH (ref 1.7–7.7)
Platelets: 224 10*3/uL (ref 150–400)
RBC: 3.18 MIL/uL — ABNORMAL LOW (ref 3.87–5.11)
RDW: 13.3 % (ref 11.5–15.5)
WBC: 14.4 10*3/uL — ABNORMAL HIGH (ref 4.0–10.5)

## 2017-05-23 LAB — BASIC METABOLIC PANEL
ANION GAP: 11 (ref 5–15)
BUN: 23 mg/dL — ABNORMAL HIGH (ref 6–20)
CO2: 25 mmol/L (ref 22–32)
Calcium: 8.4 mg/dL — ABNORMAL LOW (ref 8.9–10.3)
Chloride: 102 mmol/L (ref 101–111)
Creatinine, Ser: 1.58 mg/dL — ABNORMAL HIGH (ref 0.44–1.00)
GFR calc Af Amer: 31 mL/min — ABNORMAL LOW (ref >60)
GFR, EST NON AFRICAN AMERICAN: 27 mL/min — AB (ref >60)
GLUCOSE: 129 mg/dL — AB (ref 65–99)
POTASSIUM: 4.1 mmol/L (ref 3.5–5.1)
Sodium: 138 mmol/L (ref 135–145)

## 2017-05-23 MED ORDER — FENTANYL CITRATE (PF) 100 MCG/2ML IJ SOLN
50.0000 ug | Freq: Once | INTRAMUSCULAR | Status: AC
  Administered 2017-05-23: 50 ug via INTRAVENOUS
  Filled 2017-05-23: qty 2

## 2017-05-23 NOTE — ED Triage Notes (Signed)
Pt arriving from home for unwitnessed fall. Pt A&O. Pt complaining of left shoulder and left hip pain. Possible deformity of left shoulder noted. Pt given Fentanyl prior to arrival. Pt arriving on 2L oxygen via nasal cannula.   221/78 20g R Forearm placed by EMS

## 2017-05-23 NOTE — ED Notes (Signed)
ED Provider at bedside. 

## 2017-05-23 NOTE — ED Provider Notes (Signed)
[facility]-EMERGENCY DEPT Provider Note   CSN: [id number] Arrival date & time: 05/23/17  2108     History   Chief Complaint Chief Complaint  Patient presents with  . Fall    HPI Meagan Osborn is a 84 y.o. female.  The history is provided by the patient. No language interpreter was used.  Fall     Meagan Osborn is a 84 y.o. female who presents to the Emergency Department complaining of fall. He presents for evaluation following fall. She was at home when she was walking into her kitchen and had a fall. She is unsure what caused her to fall. She struck her head, shoulder and hip. She reports severe pain to the left shoulder and arm as well as left hip. Unknown if there is any loss of consciousness or preceding symptoms. Symptoms are severe and constant in nature. She resides with her daughter. Her daughter states that patient has been feeling poorly the last few days and was diagnosed with URI earlier today and took the first dose of azithromycin this afternoon.  Past Medical History:  Diagnosis Date  . Anxiety   . Arthritis   . Asthma   . Closed fracture of left proximal humerus 05/23/2017  . Hypertension   . Hypothyroidism   . Left rib fracture 05/23/2017  . Macular degeneration   . Osteoporosis   . Pubic ramus fracture, left, closed, initial encounter (HCC) 05/23/2017  . Thyroid disease     Patient Active Problem List   Diagnosis Date Noted  . Closed fracture of left proximal humerus 05/23/2017  . Left rib fracture 05/23/2017  . Pubic ramus fracture, left, closed, initial encounter (HCC) 05/23/2017  . Swallowing impaired 12/13/2016  . Pulmonary nodule 11/27/2015  . Allergic rhinitis 09/26/2014  . Asthma, chronic 08/16/2014    Past Surgical History:  Procedure Laterality Date  . APPENDECTOMY    . EYE SURGERY       OB History   None      Home Medications    Prior to Admission medications   Medication Sig Start Date End Date Taking?  Authorizing Provider  amLODipine (NORVASC) 2.5 MG tablet Take 2.5 mg by mouth 2 (two) times daily.  03/15/14   [provider]  levalbuterol Pauline Aus(XOPENEX HFA) 45 MCG/ACT inhaler Inhale 2 puffs into the lungs every 4 (four) hours as needed for wheezing or shortness of breath. 02/18/17   Leslye PeerByrum, Robert S, MD  levothyroxine (SYNTHROID, LEVOTHROID) 25 MCG tablet Take 25 mcg by mouth daily before breakfast.    [provider]  loratadine (CLARITIN) 10 MG tablet Take 10 mg by mouth daily.    [provider]  SYMBICORT 80-4.5 MCG/ACT inhaler INHALE 2 PUFFS INTO THE LUNGS TWICE A DAY 04/05/16   Nyoka CowdenWert, Michael B, MD  SYMBICORT 80-4.5 MCG/ACT inhaler INHALE 2 PUFFS INTO THE LUNGS TWICE A DAY 08/05/16   Leslye PeerByrum, Robert S, MD    Family History No family history on file.  Social History Social History   Tobacco Use  . Smoking status: Never Smoker  . Smokeless tobacco: Never Used  Substance Use Topics  . Alcohol use: No  . Drug use: Not on file     Allergies   Patient has no known allergies.   Review of Systems Review of Systems  All other systems reviewed and are negative.    Physical Exam Updated Vital Signs BP (!) 211/85 (BP Location: Right Arm)   Pulse 84   Temp  98.6 F (37 C) (Oral)   Resp 12   Ht 5\' 1"  (1.549 m)   Wt 52.2 kg (115 lb)   SpO2 98%   BMI 21.73 kg/m   Physical Exam  Constitutional: She is oriented to person, place, and time. She appears well-developed and well-nourished.  HENT:  Head: Normocephalic.  Left peri orbital ecchymosis  Cardiovascular: Normal rate and regular rhythm.  No murmur heard. Pulmonary/Chest: Effort normal and breath sounds normal. No respiratory distress.  Abdominal: Soft. There is no tenderness. There is no rebound and no guarding.  Musculoskeletal: She exhibits no tenderness.  2+ DP pulses bilaterally. There is tenderness to palpation over the left hip. Position of comfort for the left hip is flexed. There is no  tenderness to palpation over the right hip but there is pain in the hip with extension of the right lower extremity. There is tenderness to palpation over the left shoulder and upper arm.  2+ radial pulses bilaterally.    Neurological: She is alert and oriented to person, place, and time.  Skin: Skin is warm and dry.  Psychiatric: She has a normal mood and affect. Her behavior is normal.  Nursing note and vitals reviewed.    ED Treatments / Results  Labs (all labs ordered are listed, but only abnormal results are displayed) Labs Reviewed  CBC WITH DIFFERENTIAL/PLATELET - Abnormal; Notable for the following components:      Result Value   WBC 14.4 (*)    RBC 3.18 (*)    Hemoglobin 9.9 (*)    HCT 30.6 (*)    Neutro Abs 11.6 (*)    All other components within normal limits  BASIC METABOLIC PANEL - Abnormal; Notable for the following components:   Glucose, Bld 129 (*)    BUN 23 (*)    Creatinine, Ser 1.58 (*)    Calcium 8.4 (*)    GFR calc non Af Amer 27 (*)    GFR calc Af Amer 31 (*)    All other components within normal limits  HEPATIC FUNCTION PANEL - Abnormal; Notable for the following components:   Total Protein 6.3 (*)    Albumin 3.3 (*)    All other components within normal limits  URINALYSIS, ROUTINE W REFLEX MICROSCOPIC  BRAIN NATRIURETIC PEPTIDE  I-STAT TROPONIN, ED    EKG EKG Interpretation  Date/Time:  Friday May 23 2017 22:11:40 EDT Ventricular Rate:  94 PR Interval:    QRS Duration: 77 QT Interval:  360 QTC Calculation: 451 R Axis:   79 Text Interpretation:  Sinus rhythm Left ventricular hypertrophy Nonspecific T abnrm, anterolateral leads Confirmed by Tilden Fossa [phone]) on 05/23/2017 10:16:19 PM   Radiology Dg Chest 1 View  Result Date: 05/23/2017 CLINICAL DATA:  Fall EXAM: CHEST  1 VIEW COMPARISON:  06/13/2016 FINDINGS: Mild cardiomegaly. No consolidation or pleural effusion. Calcified granuloma bilaterally. Aortic atherosclerosis. No  pneumothorax. Acute displaced proximal left humerus fracture. Acute displaced left sixth rib fracture IMPRESSION: 1. Negative for pneumothorax or pleural effusion 2. Acute displaced left 6 rib fracture. Acute displaced proximal humerus fracture on the left 3. Mild cardiomegaly Electronically Signed   By: Jasmine Pang M.D.   On: 05/23/2017 23:16   Ct Head Wo Contrast  Result Date: 05/23/2017 CLINICAL DATA:  Pain after head trauma. EXAM: CT HEAD WITHOUT CONTRAST CT CERVICAL SPINE WITHOUT CONTRAST TECHNIQUE: Multidetector CT imaging of the head and cervical spine was performed following the standard protocol without intravenous contrast. Multiplanar CT image reconstructions of the  cervical spine were also generated. COMPARISON:  None. FINDINGS: CT HEAD FINDINGS Brain: Moderate chronic appearing small vessel ischemia with atrophy. Large vascular territory infarct, intra-axial mass nor extra-axial fluid collections. Midline fourth ventricle and basal cisterns. No effacement. Vascular: Atherosclerosis of the carotid siphons. No hyperdense vessel sign. Skull: No skull fracture or suspicious osseous lesions. Arachnoid granulations along the occiput of the skull. Sinuses/Orbits: Clear mastoids. Bilateral lens replacements. Ethmoid and sphenoid sinus mucosal thickening without air-fluid levels. Other: Left parietal scalp contusion. CT CERVICAL SPINE FINDINGS Alignment: Degenerative disc and facet mediated grade 1 anterolisthesis of C4 on C5 with marked disc space narrowing. Intact craniocervical relationship and atlantodental interval. Skull base and vertebrae: Marked osteoarthritis of the atlantodental interval with sclerosis and joint space narrowing. No skull base nor cervical spine fracture. Degenerative subcortical cystic change of the odontoid process. Soft tissues and spinal canal: No prevertebral soft tissue swelling. No visible canal hematoma. Disc levels: Cervical spondylitic change most pronounced from C4  through C7. Lesser degree of disc space narrowing C2-3 and C3-4. Multilevel degenerative facet arthropathy, ankylosed at C5-6 on the right. Uncovertebral joint osteoarthritis from C3-4 on the right and bilaterally at C4-5, C5-6 and C6-7 contributing to mild foraminal encroachment at C5-6 and C6-7 bilaterally. Upper chest: Biapical pleuroparenchymal scarring. Other: Extracranial carotid arteriosclerosis bilaterally. IMPRESSION: 1. Left parietal scalp contusion without underlying skull fracture. 2. Cerebral atrophy with chronic appearing small vessel ischemia. 3. Cervical spondylosis without acute cervical spine fracture. 4. Grade 1 anterolisthesis of C4 on C5 mediated by degenerative disc and facet arthropathy. Electronically Signed   By: Tollie Eth M.D.   On: 05/23/2017 23:00   Ct Cervical Spine Wo Contrast  Result Date: 05/23/2017 CLINICAL DATA:  Pain after head trauma. EXAM: CT HEAD WITHOUT CONTRAST CT CERVICAL SPINE WITHOUT CONTRAST TECHNIQUE: Multidetector CT imaging of the head and cervical spine was performed following the standard protocol without intravenous contrast. Multiplanar CT image reconstructions of the cervical spine were also generated. COMPARISON:  None. FINDINGS: CT HEAD FINDINGS Brain: Moderate chronic appearing small vessel ischemia with atrophy. Large vascular territory infarct, intra-axial mass nor extra-axial fluid collections. Midline fourth ventricle and basal cisterns. No effacement. Vascular: Atherosclerosis of the carotid siphons. No hyperdense vessel sign. Skull: No skull fracture or suspicious osseous lesions. Arachnoid granulations along the occiput of the skull. Sinuses/Orbits: Clear mastoids. Bilateral lens replacements. Ethmoid and sphenoid sinus mucosal thickening without air-fluid levels. Other: Left parietal scalp contusion. CT CERVICAL SPINE FINDINGS Alignment: Degenerative disc and facet mediated grade 1 anterolisthesis of C4 on C5 with marked disc space narrowing.  Intact craniocervical relationship and atlantodental interval. Skull base and vertebrae: Marked osteoarthritis of the atlantodental interval with sclerosis and joint space narrowing. No skull base nor cervical spine fracture. Degenerative subcortical cystic change of the odontoid process. Soft tissues and spinal canal: No prevertebral soft tissue swelling. No visible canal hematoma. Disc levels: Cervical spondylitic change most pronounced from C4 through C7. Lesser degree of disc space narrowing C2-3 and C3-4. Multilevel degenerative facet arthropathy, ankylosed at C5-6 on the right. Uncovertebral joint osteoarthritis from C3-4 on the right and bilaterally at C4-5, C5-6 and C6-7 contributing to mild foraminal encroachment at C5-6 and C6-7 bilaterally. Upper chest: Biapical pleuroparenchymal scarring. Other: Extracranial carotid arteriosclerosis bilaterally. IMPRESSION: 1. Left parietal scalp contusion without underlying skull fracture. 2. Cerebral atrophy with chronic appearing small vessel ischemia. 3. Cervical spondylosis without acute cervical spine fracture. 4. Grade 1 anterolisthesis of C4 on C5 mediated by degenerative disc and facet arthropathy. Electronically  Signed   By: Tollie Eth M.D.   On: 05/23/2017 23:00   Dg Humerus Left  Result Date: 05/23/2017 CLINICAL DATA:  Fall EXAM: LEFT HUMERUS - 2+ VIEW COMPARISON:  None. FINDINGS: Acute fracture involving the proximal shaft of the humerus with moderate varus angulation of distal fracture fragment and about 1/2 bone with displacement of distal fracture fragment. Left humeral head projects over the glenoid. Mild overriding. IMPRESSION: Acute displaced and angulated proximal humerus fracture Electronically Signed   By: Jasmine Pang M.D.   On: 05/23/2017 23:12   Dg Hips Bilat With Pelvis Min 5 Views  Result Date: 05/23/2017 CLINICAL DATA:  Fall EXAM: DG HIP (WITH OR WITHOUT PELVIS) 5+V BILAT COMPARISON:  None. FINDINGS: Mild SI joint degenerative  changes. Right femoral head projects in joint. Possible old right superior pubic ramus fracture. Proximal left femur is intact. Acute fractures involving the left superior and inferior pubic rami. IMPRESSION: 1. Acute minimally displaced fractures involving the left superior and inferior pubic rami 2. No definite acute osseous abnormality of the right hip Electronically Signed   By: Jasmine Pang M.D.   On: 05/23/2017 23:14    Procedures Procedures (including critical care time)  Medications Ordered in ED Medications  fentaNYL (SUBLIMAZE) injection 50 mcg (50 mcg Intravenous Given 05/23/17 2204)     Initial Impression / Assessment and Plan / ED Course  I have reviewed the triage vital signs and the nursing notes.  Pertinent labs & imaging results that were available during my care of the patient were reviewed by me and considered in my medical decision making (see chart for details).     Patient here for evaluation following an unwitnessed fall. She has multiple injuries on evaluation. Labs demonstrate anemia with hemoglobin of 9.9, similar when compared to prior in October 2018 and care everywhere. She had the left proximal humerus fractures, left rib fracture as well as superior and inferior pubic Ramiah fractures. Discussed with Dr. Dion Saucier with orthopedics, he will see the patient and consult. Hospitalist consulted for admission for further evaluation and treatment. Patient and family updated findings of studies and recommendation for admission for further treatment and they are in agreement with treatment plan.  Final Clinical Impressions(s) / ED Diagnoses   Final diagnoses:  Fall    ED Discharge Orders    None       Tilden Fossa, MD 05/24/17 0006

## 2017-05-23 NOTE — ED Notes (Signed)
Bed: WA21 Expected date:  Expected time:  Means of arrival:  Comments: EMS 84 yo fall-fentanyl

## 2017-05-24 ENCOUNTER — Inpatient Hospital Stay (HOSPITAL_COMMUNITY): Payer: Medicare Other

## 2017-05-24 DIAGNOSIS — Z7189 Other specified counseling: Secondary | ICD-10-CM | POA: Diagnosis not present

## 2017-05-24 DIAGNOSIS — N184 Chronic kidney disease, stage 4 (severe): Secondary | ICD-10-CM | POA: Diagnosis present

## 2017-05-24 DIAGNOSIS — Z515 Encounter for palliative care: Secondary | ICD-10-CM | POA: Diagnosis not present

## 2017-05-24 DIAGNOSIS — J069 Acute upper respiratory infection, unspecified: Secondary | ICD-10-CM | POA: Diagnosis present

## 2017-05-24 DIAGNOSIS — E8770 Fluid overload, unspecified: Secondary | ICD-10-CM | POA: Diagnosis not present

## 2017-05-24 DIAGNOSIS — I5033 Acute on chronic diastolic (congestive) heart failure: Secondary | ICD-10-CM | POA: Diagnosis present

## 2017-05-24 DIAGNOSIS — M810 Age-related osteoporosis without current pathological fracture: Secondary | ICD-10-CM | POA: Diagnosis present

## 2017-05-24 DIAGNOSIS — F0280 Dementia in other diseases classified elsewhere without behavioral disturbance: Secondary | ICD-10-CM | POA: Diagnosis present

## 2017-05-24 DIAGNOSIS — W1830XA Fall on same level, unspecified, initial encounter: Secondary | ICD-10-CM | POA: Diagnosis not present

## 2017-05-24 DIAGNOSIS — I2729 Other secondary pulmonary hypertension: Secondary | ICD-10-CM | POA: Diagnosis present

## 2017-05-24 DIAGNOSIS — W19XXXS Unspecified fall, sequela: Secondary | ICD-10-CM | POA: Diagnosis not present

## 2017-05-24 DIAGNOSIS — I951 Orthostatic hypotension: Secondary | ICD-10-CM | POA: Diagnosis present

## 2017-05-24 DIAGNOSIS — E039 Hypothyroidism, unspecified: Secondary | ICD-10-CM | POA: Diagnosis present

## 2017-05-24 DIAGNOSIS — I130 Hypertensive heart and chronic kidney disease with heart failure and stage 1 through stage 4 chronic kidney disease, or unspecified chronic kidney disease: Secondary | ICD-10-CM | POA: Diagnosis present

## 2017-05-24 DIAGNOSIS — N179 Acute kidney failure, unspecified: Secondary | ICD-10-CM | POA: Diagnosis not present

## 2017-05-24 DIAGNOSIS — S0003XA Contusion of scalp, initial encounter: Secondary | ICD-10-CM | POA: Diagnosis present

## 2017-05-24 DIAGNOSIS — S42202A Unspecified fracture of upper end of left humerus, initial encounter for closed fracture: Secondary | ICD-10-CM | POA: Diagnosis present

## 2017-05-24 DIAGNOSIS — S2232XA Fracture of one rib, left side, initial encounter for closed fracture: Secondary | ICD-10-CM | POA: Diagnosis present

## 2017-05-24 DIAGNOSIS — S329XXA Fracture of unspecified parts of lumbosacral spine and pelvis, initial encounter for closed fracture: Secondary | ICD-10-CM | POA: Diagnosis present

## 2017-05-24 DIAGNOSIS — J9601 Acute respiratory failure with hypoxia: Secondary | ICD-10-CM | POA: Diagnosis present

## 2017-05-24 DIAGNOSIS — I080 Rheumatic disorders of both mitral and aortic valves: Secondary | ICD-10-CM | POA: Diagnosis present

## 2017-05-24 DIAGNOSIS — Z66 Do not resuscitate: Secondary | ICD-10-CM | POA: Diagnosis not present

## 2017-05-24 DIAGNOSIS — G309 Alzheimer's disease, unspecified: Secondary | ICD-10-CM | POA: Diagnosis present

## 2017-05-24 DIAGNOSIS — J90 Pleural effusion, not elsewhere classified: Secondary | ICD-10-CM | POA: Diagnosis not present

## 2017-05-24 DIAGNOSIS — M4312 Spondylolisthesis, cervical region: Secondary | ICD-10-CM | POA: Diagnosis present

## 2017-05-24 DIAGNOSIS — S32592A Other specified fracture of left pubis, initial encounter for closed fracture: Secondary | ICD-10-CM | POA: Diagnosis present

## 2017-05-24 DIAGNOSIS — D72828 Other elevated white blood cell count: Secondary | ICD-10-CM | POA: Diagnosis present

## 2017-05-24 DIAGNOSIS — W19XXXA Unspecified fall, initial encounter: Secondary | ICD-10-CM | POA: Diagnosis not present

## 2017-05-24 DIAGNOSIS — E876 Hypokalemia: Secondary | ICD-10-CM | POA: Diagnosis present

## 2017-05-24 DIAGNOSIS — D62 Acute posthemorrhagic anemia: Secondary | ICD-10-CM | POA: Diagnosis not present

## 2017-05-24 DIAGNOSIS — Y92000 Kitchen of unspecified non-institutional (private) residence as  the place of occurrence of the external cause: Secondary | ICD-10-CM | POA: Diagnosis not present

## 2017-05-24 DIAGNOSIS — I340 Nonrheumatic mitral (valve) insufficiency: Secondary | ICD-10-CM | POA: Diagnosis not present

## 2017-05-24 DIAGNOSIS — R1310 Dysphagia, unspecified: Secondary | ICD-10-CM | POA: Diagnosis not present

## 2017-05-24 LAB — URINALYSIS, ROUTINE W REFLEX MICROSCOPIC
Bilirubin Urine: NEGATIVE
Glucose, UA: NEGATIVE mg/dL
Ketones, ur: NEGATIVE mg/dL
Leukocytes, UA: NEGATIVE
Nitrite: NEGATIVE
Specific Gravity, Urine: 1.014 (ref 1.005–1.030)
pH: 5 (ref 5.0–8.0)

## 2017-05-24 LAB — CBC
HEMATOCRIT: 27 % — AB (ref 36.0–46.0)
HEMOGLOBIN: 8.9 g/dL — AB (ref 12.0–15.0)
MCH: 31.8 pg (ref 26.0–34.0)
MCHC: 33 g/dL (ref 30.0–36.0)
MCV: 96.4 fL (ref 78.0–100.0)
Platelets: 199 10*3/uL (ref 150–400)
RBC: 2.8 MIL/uL — ABNORMAL LOW (ref 3.87–5.11)
RDW: 13.3 % (ref 11.5–15.5)
WBC: 14 10*3/uL — ABNORMAL HIGH (ref 4.0–10.5)

## 2017-05-24 LAB — BASIC METABOLIC PANEL
Anion gap: 8 (ref 5–15)
BUN: 25 mg/dL — AB (ref 6–20)
CALCIUM: 8 mg/dL — AB (ref 8.9–10.3)
CHLORIDE: 104 mmol/L (ref 101–111)
CO2: 26 mmol/L (ref 22–32)
CREATININE: 1.48 mg/dL — AB (ref 0.44–1.00)
GFR calc non Af Amer: 29 mL/min — ABNORMAL LOW (ref >60)
GFR, EST AFRICAN AMERICAN: 34 mL/min — AB (ref >60)
GLUCOSE: 122 mg/dL — AB (ref 65–99)
Potassium: 4.3 mmol/L (ref 3.5–5.1)
Sodium: 138 mmol/L (ref 135–145)

## 2017-05-24 LAB — I-STAT TROPONIN, ED: Troponin i, poc: 0.01 ng/mL (ref 0.00–0.08)

## 2017-05-24 LAB — TROPONIN I
TROPONIN I: 0.04 ng/mL — AB (ref <0.03)
Troponin I: 0.05 ng/mL (ref <0.03)
Troponin I: 0.05 ng/mL (ref <0.03)

## 2017-05-24 LAB — BRAIN NATRIURETIC PEPTIDE: B NATRIURETIC PEPTIDE 5: 375.8 pg/mL — AB (ref 0.0–100.0)

## 2017-05-24 MED ORDER — ONDANSETRON HCL 4 MG/2ML IJ SOLN
4.0000 mg | Freq: Four times a day (QID) | INTRAMUSCULAR | Status: DC | PRN
  Administered 2017-05-24 – 2017-05-26 (×5): 4 mg via INTRAVENOUS
  Filled 2017-05-24 (×5): qty 2

## 2017-05-24 MED ORDER — CEFAZOLIN SODIUM-DEXTROSE 2-4 GM/100ML-% IV SOLN
2.0000 g | INTRAVENOUS | Status: DC
  Filled 2017-05-24: qty 100

## 2017-05-24 MED ORDER — SODIUM CHLORIDE 0.9 % IV SOLN
INTRAVENOUS | Status: DC
  Administered 2017-05-24 – 2017-05-25 (×2): via INTRAVENOUS

## 2017-05-24 MED ORDER — SODIUM CHLORIDE 0.9 % IV SOLN
INTRAVENOUS | Status: AC
  Administered 2017-05-24: 03:00:00 via INTRAVENOUS

## 2017-05-24 MED ORDER — VITAMINS A & D EX OINT
TOPICAL_OINTMENT | CUTANEOUS | Status: AC
  Filled 2017-05-24: qty 5

## 2017-05-24 MED ORDER — MOMETASONE FURO-FORMOTEROL FUM 100-5 MCG/ACT IN AERO
2.0000 | INHALATION_SPRAY | Freq: Two times a day (BID) | RESPIRATORY_TRACT | Status: DC
  Administered 2017-05-24 – 2017-06-03 (×17): 2 via RESPIRATORY_TRACT
  Filled 2017-05-24 (×2): qty 8.8

## 2017-05-24 MED ORDER — LABETALOL HCL 5 MG/ML IV SOLN
5.0000 mg | INTRAVENOUS | Status: DC | PRN
  Filled 2017-05-24: qty 4

## 2017-05-24 MED ORDER — ONDANSETRON HCL 4 MG PO TABS: 4.0000 mg | ORAL_TABLET | Freq: Four times a day (QID) | ORAL | Status: DC | PRN

## 2017-05-24 MED ORDER — LABETALOL HCL 5 MG/ML IV SOLN: 5.0000 mg | INTRAVENOUS | Status: DC | PRN

## 2017-05-24 MED ORDER — LEVALBUTEROL TARTRATE 45 MCG/ACT IN AERO: 2.0000 | INHALATION_SPRAY | RESPIRATORY_TRACT | Status: DC | PRN

## 2017-05-24 MED ORDER — MORPHINE SULFATE (PF) 4 MG/ML IV SOLN
2.0000 mg | INTRAVENOUS | Status: DC | PRN
  Administered 2017-05-24 – 2017-05-25 (×3): 2 mg via INTRAVENOUS
  Filled 2017-05-24 (×3): qty 1

## 2017-05-24 MED ORDER — LABETALOL HCL 5 MG/ML IV SOLN: 5.0000 mg | INTRAVENOUS | Status: DC

## 2017-05-24 MED ORDER — BACLOFEN 10 MG PO TABS
10.0000 mg | ORAL_TABLET | Freq: Once | ORAL | Status: AC
  Administered 2017-05-24: 10 mg via ORAL
  Filled 2017-05-24: qty 1

## 2017-05-24 MED ORDER — LEVALBUTEROL HCL 0.63 MG/3ML IN NEBU
0.6300 mg | INHALATION_SOLUTION | Freq: Four times a day (QID) | RESPIRATORY_TRACT | Status: DC | PRN
  Administered 2017-05-28 (×2): 0.63 mg via RESPIRATORY_TRACT
  Filled 2017-05-24 (×2): qty 3

## 2017-05-24 MED ORDER — MORPHINE SULFATE (PF) 2 MG/ML IV SOLN: 2.0000 mg | INTRAVENOUS | Status: DC | PRN

## 2017-05-24 MED ORDER — CHLORHEXIDINE GLUCONATE 4 % EX LIQD
60.0000 mL | Freq: Once | CUTANEOUS | Status: DC
  Filled 2017-05-24 (×2): qty 60

## 2017-05-24 MED ORDER — HYDROCODONE-ACETAMINOPHEN 5-325 MG PO TABS
1.0000 | ORAL_TABLET | Freq: Once | ORAL | Status: AC
  Administered 2017-05-24: 1 via ORAL
  Filled 2017-05-24: qty 1

## 2017-05-24 MED ORDER — DICLOFENAC SODIUM 1 % TD GEL
2.0000 g | Freq: Four times a day (QID) | TRANSDERMAL | Status: DC
  Administered 2017-05-25 – 2017-05-26 (×2): 2 g via TOPICAL
  Filled 2017-05-24 (×2): qty 100

## 2017-05-24 MED ORDER — AMLODIPINE 1 MG/ML ORAL SUSPENSION: 5.0000 mg | Freq: Every day | ORAL | Status: DC

## 2017-05-24 MED ORDER — POVIDONE-IODINE 10 % EX SWAB: 2.0000 "application " | Freq: Once | CUTANEOUS | Status: DC

## 2017-05-24 MED ORDER — LEVOTHYROXINE SODIUM 25 MCG PO TABS
25.0000 ug | ORAL_TABLET | Freq: Every day | ORAL | Status: DC
  Administered 2017-05-24: 25 ug via ORAL
  Filled 2017-05-24: qty 1

## 2017-05-24 MED ORDER — LORATADINE 10 MG PO TABS
10.0000 mg | ORAL_TABLET | Freq: Every day | ORAL | Status: DC
  Administered 2017-05-24: 10 mg via ORAL
  Filled 2017-05-24 (×2): qty 1

## 2017-05-24 MED ORDER — MORPHINE SULFATE (PF) 4 MG/ML IV SOLN
2.0000 mg | INTRAVENOUS | Status: DC | PRN
  Administered 2017-05-24 (×2): 2 mg via INTRAVENOUS
  Filled 2017-05-24 (×2): qty 1

## 2017-05-24 MED ORDER — HYDROCODONE-ACETAMINOPHEN 5-325 MG PO TABS
1.0000 | ORAL_TABLET | ORAL | Status: DC | PRN
  Administered 2017-05-24 (×3): 1 via ORAL
  Filled 2017-05-24 (×4): qty 1

## 2017-05-24 MED ORDER — ENSURE ENLIVE PO LIQD
237.0000 mL | Freq: Two times a day (BID) | ORAL | Status: DC
  Administered 2017-05-27 – 2017-06-03 (×9): 237 mL via ORAL

## 2017-05-24 NOTE — ED Notes (Signed)
ED TO INPATIENT HANDOFF REPORT  Name/Age/Gender Meagan Osborn 84 y.o. female  Code Status Advance Directive Documentation     Most Recent Value  Type of Advance Directive  Living will  Pre-existing out of facility DNR order (yellow form or pink MOST form)  -  "MOST" Form in Place?  -      Home/SNF/Other Home  Chief Complaint fall possible shoulder and hip injury  Level of Care/Admitting Diagnosis ED Disposition    ED Disposition Condition [place]: [place] [100102]  Level of Care: Telemetry [5]  Admit to tele based on following criteria: Other see comments  Comments: Fall  Diagnosis: Pelvic fracture [facility]) [[id number]]  Admitting Physician: Bethena Roys [phone]  Attending Physician: Bethena Roys Nessa.Cuff  Estimated length of stay: past midnight tomorrow  Certification:: I certify this patient will need inpatient services for at least 2 midnights  PT Class (Do Not Modify): Inpatient [101]  PT Acc Code (Do Not Modify): Private [1]       Medical History Past Medical History:  Diagnosis Date  . Anxiety   . Arthritis   . Asthma   . Closed fracture of left proximal humerus 05/23/2017  . Hypertension   . Hypothyroidism   . Left rib fracture 05/23/2017  . Macular degeneration   . Osteoporosis   . Pubic ramus fracture, left, closed, initial encounter ([facility]) 05/23/2017  . Thyroid disease     Allergies Allergies  Allergen Reactions  . Sulfa Antibiotics     Other reaction(s): Other (See Comments) unknown    IV Location/Drains/Wounds Patient Lines/Drains/Airways Status   Active Line/Drains/Airways    Name:   Placement date:   Placement time:   Site:   Days:   Peripheral IV 05/23/17 Forearm   05/23/17    -    Forearm   1          Labs/Imaging Results for orders placed or performed during the hospital encounter of 05/23/17 (from the past 48 hour(s))  CBC with Differential     Status: Abnormal    Collection Time: 05/23/17  9:40 PM  Result Value Ref Range   WBC 14.4 (H) 4.0 - 10.5 K/uL   RBC 3.18 (L) 3.87 - 5.11 MIL/uL   Hemoglobin 9.9 (L) 12.0 - 15.0 g/dL   HCT 30.6 (L) 36.0 - 46.0 %   MCV 96.2 78.0 - 100.0 fL   MCH 31.1 26.0 - 34.0 pg   MCHC 32.4 30.0 - 36.0 g/dL   RDW 13.3 11.5 - 15.5 %   Platelets 224 150 - 400 K/uL   Neutrophils Relative % 80 %   Lymphocytes Relative 12 %   Monocytes Relative 7 %   Eosinophils Relative 1 %   Basophils Relative 0 %   Neutro Abs 11.6 (H) 1.7 - 7.7 K/uL   Lymphs Abs 1.7 0.7 - 4.0 K/uL   Monocytes Absolute 1.0 0.1 - 1.0 K/uL   Eosinophils Absolute 0.1 0.0 - 0.7 K/uL   Basophils Absolute 0.0 0.0 - 0.1 K/uL   RBC Morphology POLYCHROMASIA PRESENT    WBC Morphology VACUOLATED NEUTROPHILS     Comment: Performed at [facility], [place] [address] [place], [place] [address]  Basic metabolic panel     Status: Abnormal   Collection Time: 05/23/17  9:40 PM  Result Value Ref Range   Sodium 138 135 - 145 mmol/L   Potassium 4.1 3.5 - 5.1 mmol/L   Chloride  102 101 - 111 mmol/L   CO2 25 22 - 32 mmol/L   Glucose, Bld 129 (H) 65 - 99 mg/dL   BUN 23 (H) 6 - 20 mg/dL   Creatinine, Ser 1.58 (H) 0.44 - 1.00 mg/dL   Calcium 8.4 (L) 8.9 - 10.3 mg/dL   GFR calc non Af Amer 27 (L) >60 mL/min   GFR calc Af Amer 31 (L) >60 mL/min    Comment: (NOTE) The eGFR has been calculated using the CKD EPI equation. This calculation has not been validated in all clinical situations. eGFR's persistently <60 mL/min signify possible Chronic Kidney Disease.    Anion gap 11 5 - 15    Comment: Performed at [facility], [place] [address] [place], [place] [address]  Hepatic function panel     Status: Abnormal   Collection Time: 05/23/17  9:40 PM  Result Value Ref Range   Total Protein 6.3 (L) 6.5 - 8.1 g/dL   Albumin 3.3 (L) 3.5 - 5.0 g/dL   AST 19 15 - 41 U/L   ALT 18 14 - 54 U/L   Alkaline Phosphatase 52 38 - 126 U/L   Total  Bilirubin 0.6 0.3 - 1.2 mg/dL   Bilirubin, Direct 0.1 0.1 - 0.5 mg/dL   Indirect Bilirubin 0.5 0.3 - 0.9 mg/dL    Comment: Performed at [facility], [place] [address], [place], [place] [address]  Brain natriuretic peptide     Status: Abnormal   Collection Time: 05/24/17 12:10 AM  Result Value Ref Range   B Natriuretic Peptide 375.8 (H) 0.0 - 100.0 pg/mL    Comment: Performed at [facility], [place] [address] [place], [place] [address]  I-stat troponin, ED     Status: None   Collection Time: 05/24/17 12:18 AM  Result Value Ref Range   Troponin i, poc 0.01 0.00 - 0.08 ng/mL   Comment 3            Comment: Due to the release kinetics of cTnI, a negative result within the first hours of the onset of symptoms does not rule out myocardial infarction with certainty. If myocardial infarction is still suspected, repeat the test at appropriate intervals.    Dg Chest 1 View  Result Date: 05/23/2017 CLINICAL DATA:  Fall EXAM: CHEST  1 VIEW COMPARISON:  06/13/2016 FINDINGS: Mild cardiomegaly. No consolidation or pleural effusion. Calcified granuloma bilaterally. Aortic atherosclerosis. No pneumothorax. Acute displaced proximal left humerus fracture. Acute displaced left sixth rib fracture IMPRESSION: 1. Negative for pneumothorax or pleural effusion 2. Acute displaced left 6 rib fracture. Acute displaced proximal humerus fracture on the left 3. Mild cardiomegaly Electronically Signed   By: Donavan Foil M.D.   On: 05/23/2017 23:16   Ct Head Wo Contrast  Result Date: 05/23/2017 CLINICAL DATA:  Pain after head trauma. EXAM: CT HEAD WITHOUT CONTRAST CT CERVICAL SPINE WITHOUT CONTRAST TECHNIQUE: Multidetector CT imaging of the head and cervical spine was performed following the standard protocol without intravenous contrast. Multiplanar CT image reconstructions of the cervical spine were also generated. COMPARISON:  None. FINDINGS: CT HEAD FINDINGS Brain: Moderate chronic  appearing small vessel ischemia with atrophy. Large vascular territory infarct, intra-axial mass nor extra-axial fluid collections. Midline fourth ventricle and basal cisterns. No effacement. Vascular: Atherosclerosis of the carotid siphons. No hyperdense vessel sign. Skull: No skull fracture or suspicious osseous lesions. Arachnoid granulations along the occiput of the skull. Sinuses/Orbits: Clear mastoids. Bilateral lens replacements. Ethmoid and sphenoid sinus mucosal  thickening without air-fluid levels. Other: Left parietal scalp contusion. CT CERVICAL SPINE FINDINGS Alignment: Degenerative disc and facet mediated grade 1 anterolisthesis of C4 on C5 with marked disc space narrowing. Intact craniocervical relationship and atlantodental interval. Skull base and vertebrae: Marked osteoarthritis of the atlantodental interval with sclerosis and joint space narrowing. No skull base nor cervical spine fracture. Degenerative subcortical cystic change of the odontoid process. Soft tissues and spinal canal: No prevertebral soft tissue swelling. No visible canal hematoma. Disc levels: Cervical spondylitic change most pronounced from C4 through C7. Lesser degree of disc space narrowing C2-3 and C3-4. Multilevel degenerative facet arthropathy, ankylosed at C5-6 on the right. Uncovertebral joint osteoarthritis from C3-4 on the right and bilaterally at C4-5, C5-6 and C6-7 contributing to mild foraminal encroachment at C5-6 and C6-7 bilaterally. Upper chest: Biapical pleuroparenchymal scarring. Other: Extracranial carotid arteriosclerosis bilaterally. IMPRESSION: 1. Left parietal scalp contusion without underlying skull fracture. 2. Cerebral atrophy with chronic appearing small vessel ischemia. 3. Cervical spondylosis without acute cervical spine fracture. 4. Grade 1 anterolisthesis of C4 on C5 mediated by degenerative disc and facet arthropathy. Electronically Signed   By: Ashley Royalty M.D.   On: 05/23/2017 23:00   Ct  Cervical Spine Wo Contrast  Result Date: 05/23/2017 CLINICAL DATA:  Pain after head trauma. EXAM: CT HEAD WITHOUT CONTRAST CT CERVICAL SPINE WITHOUT CONTRAST TECHNIQUE: Multidetector CT imaging of the head and cervical spine was performed following the standard protocol without intravenous contrast. Multiplanar CT image reconstructions of the cervical spine were also generated. COMPARISON:  None. FINDINGS: CT HEAD FINDINGS Brain: Moderate chronic appearing small vessel ischemia with atrophy. Large vascular territory infarct, intra-axial mass nor extra-axial fluid collections. Midline fourth ventricle and basal cisterns. No effacement. Vascular: Atherosclerosis of the carotid siphons. No hyperdense vessel sign. Skull: No skull fracture or suspicious osseous lesions. Arachnoid granulations along the occiput of the skull. Sinuses/Orbits: Clear mastoids. Bilateral lens replacements. Ethmoid and sphenoid sinus mucosal thickening without air-fluid levels. Other: Left parietal scalp contusion. CT CERVICAL SPINE FINDINGS Alignment: Degenerative disc and facet mediated grade 1 anterolisthesis of C4 on C5 with marked disc space narrowing. Intact craniocervical relationship and atlantodental interval. Skull base and vertebrae: Marked osteoarthritis of the atlantodental interval with sclerosis and joint space narrowing. No skull base nor cervical spine fracture. Degenerative subcortical cystic change of the odontoid process. Soft tissues and spinal canal: No prevertebral soft tissue swelling. No visible canal hematoma. Disc levels: Cervical spondylitic change most pronounced from C4 through C7. Lesser degree of disc space narrowing C2-3 and C3-4. Multilevel degenerative facet arthropathy, ankylosed at C5-6 on the right. Uncovertebral joint osteoarthritis from C3-4 on the right and bilaterally at C4-5, C5-6 and C6-7 contributing to mild foraminal encroachment at C5-6 and C6-7 bilaterally. Upper chest: Biapical  pleuroparenchymal scarring. Other: Extracranial carotid arteriosclerosis bilaterally. IMPRESSION: 1. Left parietal scalp contusion without underlying skull fracture. 2. Cerebral atrophy with chronic appearing small vessel ischemia. 3. Cervical spondylosis without acute cervical spine fracture. 4. Grade 1 anterolisthesis of C4 on C5 mediated by degenerative disc and facet arthropathy. Electronically Signed   By: Ashley Royalty M.D.   On: 05/23/2017 23:00   Dg Humerus Left  Result Date: 05/23/2017 CLINICAL DATA:  Fall EXAM: LEFT HUMERUS - 2+ VIEW COMPARISON:  None. FINDINGS: Acute fracture involving the proximal shaft of the humerus with moderate varus angulation of distal fracture fragment and about 1/2 bone with displacement of distal fracture fragment. Left humeral head projects over the glenoid. Mild overriding. IMPRESSION: Acute displaced and  angulated proximal humerus fracture Electronically Signed   By: Donavan Foil M.D.   On: 05/23/2017 23:12   Dg Hips Bilat With Pelvis Min 5 Views  Result Date: 05/23/2017 CLINICAL DATA:  Fall EXAM: DG HIP (WITH OR WITHOUT PELVIS) 5+V BILAT COMPARISON:  None. FINDINGS: Mild SI joint degenerative changes. Right femoral head projects in joint. Possible old right superior pubic ramus fracture. Proximal left femur is intact. Acute fractures involving the left superior and inferior pubic rami. IMPRESSION: 1. Acute minimally displaced fractures involving the left superior and inferior pubic rami 2. No definite acute osseous abnormality of the right hip Electronically Signed   By: Donavan Foil M.D.   On: 05/23/2017 23:14    Pending Labs Unresulted Labs (From admission, onward)   Start     Ordered   05/24/17 0300  Troponin I (q 6hr x 3)  Now then every 6 hours,   R     05/24/17 0121   05/24/17 0121  Troponin I  Add-on,   R     05/24/17 0121   05/23/17 2145  Urinalysis, Routine w reflex microscopic  STAT,   STAT     05/23/17 2145      Vitals/Pain Today's Vitals    05/24/17 0003 05/24/17 0004 05/24/17 0006 05/24/17 0100  BP: (!) 174/85 (!) 174/85  (!) 145/79  Pulse:  88 87 85  Resp: (!) 22 (!) 22 (!) 23 (!) 27  Temp:      TempSrc:      SpO2:  98% 99% 99%  Weight:      Height:      PainSc:        Isolation Precautions No active isolations  Medications Medications  diclofenac sodium (VOLTAREN) 1 % transdermal gel 2 g (has no administration in time range)  labetalol (NORMODYNE,TRANDATE) injection 5 mg (has no administration in time range)  morphine 2 MG/ML injection 2 mg (has no administration in time range)  fentaNYL (SUBLIMAZE) injection 50 mcg (50 mcg Intravenous Given 05/23/17 2204)  HYDROcodone-acetaminophen (NORCO/VICODIN) 5-325 MG per tablet 1 tablet (1 tablet Oral Given 05/24/17 0021)    Mobility non-ambulatory

## 2017-05-24 NOTE — Progress Notes (Signed)
  PROGRESS NOTE  Patient admitted earlier this morning. See H&P. Meagan Osborn is a 84 y.o. female with medical history significant for asthma, CKD stage 4, progressive memory loss, who presented to the ED after patient sustained a fall at home.  Patient lives with her daughter. Patient got up from chair and was walking towards the kitchen when she suddenly fell.  Fall was unwitnessed.  When daughter heard the fall and came to her aid, patient had not lost consciousness and no witnessed seizure activities.  Daughter immediately called EMS.  Patient's has chronic orthostatic dizziness, son-in-law suspects this may her caused her fall, as patient had just gotten up from sitting position. Work up in the ED revealed multiple fractures including left pubic rami, left humerus, and left 6th rib. Orthopedic surgery has been consulted.   Pain control Troponin flat without chest pain, demand ischemic  UA without evidence of infection  Ortho to re-eval for surgery vs conservative management Will need PT and SNF on discharge Discussed code status with daughter who is HCPOA. Would like patient changed to DNR.    Meagan StainJennifer Javel Hersh, DO [facility] [email or web address] Password [email or web address] 05/24/2017, 9:46 AM

## 2017-05-24 NOTE — Progress Notes (Signed)
     Subjective:  Patient sleeping, had a difficult day today, extreme difficulty with mobility, pain all over.  Hydrocodone helped more than the morphine.  Objective:   VITALS:   Vitals:   05/24/17 0823 05/24/17 0828 05/24/17 0936 05/24/17 1358  BP: (!) 153/86 (!) 153/86  (!) 151/93  Pulse: (!) 118   93  Resp:    14  Temp:    (!) 97.5 F (36.4 C)  TempSrc:    Oral  SpO2:   97% 95%  Weight:      Height:       Patient listing to the right side in bed, I did not examine her more aggressively, positive ecchymosis over the left shoulder and left supraorbital region of the face.  Lab Results  Component Value Date   WBC 14.0 (H) 05/24/2017   HGB 8.9 (L) 05/24/2017   HCT 27.0 (L) 05/24/2017   MCV 96.4 05/24/2017   PLT 199 05/24/2017   BMET    Component Value Date/Time   NA 138 05/24/2017 0544   K 4.3 05/24/2017 0544   CL 104 05/24/2017 0544   CO2 26 05/24/2017 0544   GLUCOSE 122 (H) 05/24/2017 0544   BUN 25 (H) 05/24/2017 0544   CREATININE 1.48 (H) 05/24/2017 0544   CALCIUM 8.0 (L) 05/24/2017 0544   GFRNONAA 29 (L) 05/24/2017 0544   GFRAA 34 (L) 05/24/2017 0544     Assessment/Plan:     Principal Problem:   Closed fracture of left proximal humerus Active Problems:   Left rib fracture   Pubic ramus fracture, left, closed, initial encounter (HCC)   Pelvic fracture (HCC)   CKD (chronic kidney disease), stage IV (HCC)   The follow-up x-rays on her shoulder look improved, however given the soft tissue overlap, I suspect that she is 100% displaced on the lateral view which we do not have.  Even having said that, I had a long discussion with the family about surgical versus nonsurgical options, she was extremely functional and relatively independent before this.  Additionally, she is a polytrauma that will need to have her upper extremities functional in order to be able to restore ambulatory capacity using assistive devices.  The family has requested surgical  intervention in order to optimize recovery, minimize post fracture pain, recognizing the surgical risks.  I will plan to coordinate with the medical team, to make sure she is medically optimized, she had an echocardiogram today, possible surgery tomorrow late morning if she is optimized.  The risks benefits and alternatives were discussed with the patient including but not limited to the risks of nonoperative treatment, versus surgical intervention including infection, bleeding, nerve injury, malunion, nonunion, the need for revision surgery, hardware prominence, hardware failure, the need for hardware removal, blood clots, cardiopulmonary complications, morbidity, mortality, among others, and they were willing to proceed.     Eulas PostJoshua P Derrel Moore 05/24/2017, 8:07 PM   Teryl LucyJoshua Ladavion Savitz, MD Cell [phone]

## 2017-05-24 NOTE — Consult Note (Signed)
ORTHOPAEDIC CONSULTATION  REQUESTING PHYSICIAN: Tilden Fossaees, Elizabeth, MD  Chief Complaint: Left shoulder pain, left hip pain  HPI: Meagan Osborn is a 84 y.o. female who complains of acute left shoulder pain and left hip pain after a mechanical fall earlier today.  She cannot lift her arm.  Pain is worse with movement.  She gets periodic spasms and drawing up of her legs.  She normally functions relatively independently although has had some progression of Alzheimer's dementia, although it has not been diagnosed.  This is according to the family.  She lives with her son and daughter.  She is unable to walk.  Denies significant chest pain, but does have pain with deep breathing.  Denies any injuries to her right upper or lower extremity.  She was seen earlier today at her primary care doctor, has baseline blood pressure sometimes in the 200s, also has an active pulmonary issue, not clear if it is asthma or possibly an element of worsened congestive heart failure, she was started on a Z-Pak today.  She also has stage IV kidney disease.  Before this injury she was able to reach overhead and put on close and reaching to the top covered herself.  Past Medical History:  Diagnosis Date  . Anxiety   . Arthritis   . Asthma   . Closed fracture of left proximal humerus 05/23/2017  . Hypertension   . Hypothyroidism   . Left rib fracture 05/23/2017  . Macular degeneration   . Osteoporosis   . Pubic ramus fracture, left, closed, initial encounter ([facility]) 05/23/2017  . Thyroid disease    Past Surgical History:  Procedure Laterality Date  . APPENDECTOMY    . EYE SURGERY     Social History   Socioeconomic History  . Marital status: Married    Spouse name: Not on file  . Number of children: Not on file  . Years of education: Not on file  . Highest education level: Not on file  Occupational History  . Not on file  Social Needs  . Financial resource strain: Not on file  . Food insecurity:    Worry:  Not on file    Inability: Not on file  . Transportation needs:    Medical: Not on file    Non-medical: Not on file  Tobacco Use  . Smoking status: Never Smoker  . Smokeless tobacco: Never Used  Substance and Sexual Activity  . Alcohol use: No  . Drug use: Not on file  . Sexual activity: Not on file  Lifestyle  . Physical activity:    Days per week: Not on file    Minutes per session: Not on file  . Stress: Not on file  Relationships  . Social connections:    Talks on phone: Not on file    Gets together: Not on file    Attends religious service: Not on file    Active member of club or organization: Not on file    Attends meetings of clubs or organizations: Not on file    Relationship status: Not on file  Other Topics Concern  . Not on file  Social History Narrative  . Not on file   No family history on file. No Known Allergies   Positive ROS: All other systems have been reviewed and were otherwise negative with the exception of those mentioned in the HPI and as above.  Physical Exam: BP (!) 174/85   Pulse 88   Temp 98.6 F (37 C) (Oral)  Resp (!) 22   Ht 5\' 1"  (1.549 m)   Wt 52.2 kg (115 lb)   SpO2 98%   BMI 21.73 kg/m   General: Alert, she interacts appropriately with me, and seems to understand her situation. Cardiovascular: No pedal edema Respiratory: I do not appreciate true cyanosis or labored breathing, she does have pain with deep inspiration. GI: No organomegaly, abdomen is soft and non-tender Skin: Positive ecchymosis over the left upper extremity, no skin breaks. Neurologic: Sensation intact distally Psychiatric: Patient seems competent for consent with normal mood and affect, although her family is directly involved with and medical decision making. Lymphatic: No axillary or cervical lymphadenopathy  MUSCULOSKELETAL: Left hand has sensation and motor intact throughout.  I did not test motion of the shoulder.  Elbow is nontender on the left  side.  EHL and FHL are intact on the left lower extremity and she can draw her legs up into a 90 degree hip flexion actively.  This does cause pain, and happens while she is in pain which she seems to go through cycles with.  Positive pain to palpation along the left rib cage, none along the right.  CBC    Component Value Date/Time   WBC 14.4 (H) 05/23/2017 2140   RBC 3.18 (L) 05/23/2017 2140   HGB 9.9 (L) 05/23/2017 2140   HCT 30.6 (L) 05/23/2017 2140   PLT 224 05/23/2017 2140   MCV 96.2 05/23/2017 2140   MCH 31.1 05/23/2017 2140   MCHC 32.4 05/23/2017 2140   RDW 13.3 05/23/2017 2140   LYMPHSABS 1.7 05/23/2017 2140   MONOABS 1.0 05/23/2017 2140   EOSABS 0.1 05/23/2017 2140   BASOSABS 0.0 05/23/2017 2140   .  Assessment: Principal Problem:   Closed fracture of left proximal humerus Active Problems:   Left rib fracture   Pubic ramus fracture, left, closed, initial encounter (HCC)   She also has significant hypertension, question asthma exacerbation versus early pneumonia versus congestive heart failure, stage IV kidney disease.  Plan: This is an acute severe complicated constellation of injuries.  I discussed the options with the family.  As far as the pelvis goes she can be weightbearing as tolerated for the pubic ramus fractures.  Supportive treatment for the rib fracture, pulmonary management per the medical team.  She is going to plan for medical admission for management of her lungs, kidney disorder, and severe hypertension..  I had a long discussion with the family regarding the options on the shoulder, these would include both nonsurgical management and surgical options.  She was previously very functional, and her fracture does have a fair amount of displacement in the current position.  I am in a plan to have a sling immobilizer applied, see whether gravity can improve the position of her fracture, repeat an x-ray of her left humerus tomorrow.  I discussed the  risks benefits and alternatives for surgical intervention which would require plating, will going to wait until Sunday to make that final decision, will reassess and x-ray tomorrow to see the alignment.    Eulas Post, MD Cell [phone]   05/24/2017 12:07 AM

## 2017-05-24 NOTE — H&P (Addendum)
History and Physical    Meagan Osborn ZOX:[id number] DOB: 1920/04/07 DOA: 05/23/2017  PCP: Barbie Banner, MD   Patient coming from: Home   Chief Complaint: FALLs  HPI: Meagan Osborn is a 84 y.o. female with medical history significant for asthma, CKD4, who presented to the ED after patient sustained a fall at home.  Patient lives with her daughterDarl Pikes who is HCPOA and son-in-law.  Today patient got up and was walking towards the kitchen when she suddenly fell.  Fall was unwitnessed.  Daughter immediately called EMS.  Patient has had congestion and some cough ~2 days duration, for which she was prescribed azithromycin, no fevers or chills, endorses some poor p.o. Intake recently, one episode of vomiting last night, no diarrhea or abdominal pain.  Prior to fall patient denies chest pain or shortness of breath, no headache or focal weakness of her extremities, denies dysuria.  Patient's has chronic orthostatic dizziness, son-in-law suspects this may her caused her fall today, as patient had just gotten up from sitting position.  No history of coronary artery disease/heart attacks.  No prior falls in the past 8 years since patient had been living with her daughter.  Daughter reports some short term memory problems, worse over the past few months but has not been formally diagnosed with dementia.  Also recently patient has been pocketing food and pills refusing to swallow.  Recent barium swallow 02/18/17-10 demonstrated oral holding with all consistencies, was deemed mild aspiration risk.   ED Course: Elevated blood pressure systolic 140s-211.   Review of Systems: As per HPI otherwise 10 point review of systems negative.   Past Medical History:  Diagnosis Date  . Anxiety   . Arthritis   . Asthma   . Closed fracture of left proximal humerus 05/23/2017  . Hypertension   . Hypothyroidism   . Left rib fracture 05/23/2017  . Macular degeneration   . Osteoporosis   . Pubic ramus fracture, left,  closed, initial encounter (HCC) 05/23/2017  . Thyroid disease     Past Surgical History:  Procedure Laterality Date  . APPENDECTOMY    . EYE SURGERY       reports that she has never smoked. She has never used smokeless tobacco. She reports that she does not drink alcohol. Her drug history is not on file.  No Known Allergies  Family history not contributory.  Prior to Admission medications   Medication Sig Start Date End Date Taking? Authorizing Provider  levalbuterol [facility] HFA) 45 MCG/ACT inhaler Inhale 2 puffs into the lungs every 4 (four) hours as needed for wheezing or shortness of breath. 02/18/17   Leslye Peer, MD  levothyroxine (SYNTHROID, LEVOTHROID) 25 MCG tablet Take 25 mcg by mouth daily before breakfast.    [provider]  loratadine (CLARITIN) 10 MG tablet Take 10 mg by mouth daily.    [provider]  SYMBICORT 80-4.5 MCG/ACT inhaler INHALE 2 PUFFS INTO THE LUNGS TWICE A DAY 04/05/16   Nyoka Cowden, MD  SYMBICORT 80-4.5 MCG/ACT inhaler INHALE 2 PUFFS INTO THE LUNGS TWICE A DAY 08/05/16   Leslye Peer, MD    Physical Exam: Vitals:   05/24/17 0003 05/24/17 0004 05/24/17 0006 05/24/17 0100  BP: (!) 174/85 (!) 174/85  (!) 145/79  Pulse:  88 87 85  Resp: (!) 22 (!) 22 (!) 23 (!) 27  Temp:      TempSrc:      SpO2:  98% 99% 99%  Weight:  Height:        Constitutional: NAD, calm, comfortable, wincing from intermittent pain with movement Vitals:   05/24/17 0003 05/24/17 0004 05/24/17 0006 05/24/17 0100  BP: (!) 174/85 (!) 174/85  (!) 145/79  Pulse:  88 87 85  Resp: (!) 22 (!) 22 (!) 23 (!) 27  Temp:      TempSrc:      SpO2:  98% 99% 99%  Weight:      Height:       Eyes: PERRL, lids and conjunctivae normal.  Purplish bruising lateral side of left eye ENMT: Mucous membranes are dry. Posterior pharynx clear of any exudate or lesions.Normal dentition.  Neck: normal, supple, no masses, no thyromegaly Respiratory: clear to auscultation  bilaterally, no wheezing, no crackles. Normal respiratory effort. No accessory muscle use.  Cardiovascular: Regular rate and rhythm, no murmurs / rubs / gallops. No extremity edema. 2+ pedal pulses. No carotid bruits.  Left rib tenderness Abdomen: no tenderness, no masses palpated. No hepatosplenomegaly. Bowel sounds positive.  Musculoskeletal: no clubbing / cyanosis.  Extremities not examined fully due to pain Skin: no rashes, lesions, ulcers. No induration Neurologic: No obvious cranial nerve abnormalities Psychiatric: Normal judgment and insight. Alert and oriented x 3. Normal mood.   Labs on Admission: I have personally reviewed following labs and imaging studies  CBC: Recent Labs  Lab 05/23/17 2140  WBC 14.4*  NEUTROABS 11.6*  HGB 9.9*  HCT 30.6*  MCV 96.2  PLT 224   Basic Metabolic Panel: Recent Labs  Lab 05/23/17 2140  NA 138  K 4.1  CL 102  CO2 25  GLUCOSE 129*  BUN 23*  CREATININE 1.58*  CALCIUM 8.4*   Liver Function Tests: Recent Labs  Lab 05/23/17 2140  AST 19  ALT 18  ALKPHOS 52  BILITOT 0.6  PROT 6.3*  ALBUMIN 3.3*   Radiological Exams on Admission: Dg Chest 1 View  Result Date: 05/23/2017 CLINICAL DATA:  Fall EXAM: CHEST  1 VIEW COMPARISON:  06/13/2016 FINDINGS: Mild cardiomegaly. No consolidation or pleural effusion. Calcified granuloma bilaterally. Aortic atherosclerosis. No pneumothorax. Acute displaced proximal left humerus fracture. Acute displaced left sixth rib fracture IMPRESSION: 1. Negative for pneumothorax or pleural effusion 2. Acute displaced left 6 rib fracture. Acute displaced proximal humerus fracture on the left 3. Mild cardiomegaly Electronically Signed   By: Jasmine PangKim  Fujinaga M.D.   On: 05/23/2017 23:16   Ct Head Wo Contrast  Result Date: 05/23/2017 CLINICAL DATA:  Pain after head trauma. EXAM: CT HEAD WITHOUT CONTRAST CT CERVICAL SPINE WITHOUT CONTRAST TECHNIQUE: Multidetector CT imaging of the head and cervical spine was performed  following the standard protocol without intravenous contrast. Multiplanar CT image reconstructions of the cervical spine were also generated. COMPARISON:  None. FINDINGS: CT HEAD FINDINGS Brain: Moderate chronic appearing small vessel ischemia with atrophy. Large vascular territory infarct, intra-axial mass nor extra-axial fluid collections. Midline fourth ventricle and basal cisterns. No effacement. Vascular: Atherosclerosis of the carotid siphons. No hyperdense vessel sign. Skull: No skull fracture or suspicious osseous lesions. Arachnoid granulations along the occiput of the skull. Sinuses/Orbits: Clear mastoids. Bilateral lens replacements. Ethmoid and sphenoid sinus mucosal thickening without air-fluid levels. Other: Left parietal scalp contusion. CT CERVICAL SPINE FINDINGS Alignment: Degenerative disc and facet mediated grade 1 anterolisthesis of C4 on C5 with marked disc space narrowing. Intact craniocervical relationship and atlantodental interval. Skull base and vertebrae: Marked osteoarthritis of the atlantodental interval with sclerosis and joint space narrowing. No skull base nor cervical spine fracture.  Degenerative subcortical cystic change of the odontoid process. Soft tissues and spinal canal: No prevertebral soft tissue swelling. No visible canal hematoma. Disc levels: Cervical spondylitic change most pronounced from C4 through C7. Lesser degree of disc space narrowing C2-3 and C3-4. Multilevel degenerative facet arthropathy, ankylosed at C5-6 on the right. Uncovertebral joint osteoarthritis from C3-4 on the right and bilaterally at C4-5, C5-6 and C6-7 contributing to mild foraminal encroachment at C5-6 and C6-7 bilaterally. Upper chest: Biapical pleuroparenchymal scarring. Other: Extracranial carotid arteriosclerosis bilaterally. IMPRESSION: 1. Left parietal scalp contusion without underlying skull fracture. 2. Cerebral atrophy with chronic appearing small vessel ischemia. 3. Cervical spondylosis  without acute cervical spine fracture. 4. Grade 1 anterolisthesis of C4 on C5 mediated by degenerative disc and facet arthropathy. Electronically Signed   By: Tollie Eth M.D.   On: 05/23/2017 23:00   Ct Cervical Spine Wo Contrast  Result Date: 05/23/2017 CLINICAL DATA:  Pain after head trauma. EXAM: CT HEAD WITHOUT CONTRAST CT CERVICAL SPINE WITHOUT CONTRAST TECHNIQUE: Multidetector CT imaging of the head and cervical spine was performed following the standard protocol without intravenous contrast. Multiplanar CT image reconstructions of the cervical spine were also generated. COMPARISON:  None. FINDINGS: CT HEAD FINDINGS Brain: Moderate chronic appearing small vessel ischemia with atrophy. Large vascular territory infarct, intra-axial mass nor extra-axial fluid collections. Midline fourth ventricle and basal cisterns. No effacement. Vascular: Atherosclerosis of the carotid siphons. No hyperdense vessel sign. Skull: No skull fracture or suspicious osseous lesions. Arachnoid granulations along the occiput of the skull. Sinuses/Orbits: Clear mastoids. Bilateral lens replacements. Ethmoid and sphenoid sinus mucosal thickening without air-fluid levels. Other: Left parietal scalp contusion. CT CERVICAL SPINE FINDINGS Alignment: Degenerative disc and facet mediated grade 1 anterolisthesis of C4 on C5 with marked disc space narrowing. Intact craniocervical relationship and atlantodental interval. Skull base and vertebrae: Marked osteoarthritis of the atlantodental interval with sclerosis and joint space narrowing. No skull base nor cervical spine fracture. Degenerative subcortical cystic change of the odontoid process. Soft tissues and spinal canal: No prevertebral soft tissue swelling. No visible canal hematoma. Disc levels: Cervical spondylitic change most pronounced from C4 through C7. Lesser degree of disc space narrowing C2-3 and C3-4. Multilevel degenerative facet arthropathy, ankylosed at C5-6 on the right.  Uncovertebral joint osteoarthritis from C3-4 on the right and bilaterally at C4-5, C5-6 and C6-7 contributing to mild foraminal encroachment at C5-6 and C6-7 bilaterally. Upper chest: Biapical pleuroparenchymal scarring. Other: Extracranial carotid arteriosclerosis bilaterally. IMPRESSION: 1. Left parietal scalp contusion without underlying skull fracture. 2. Cerebral atrophy with chronic appearing small vessel ischemia. 3. Cervical spondylosis without acute cervical spine fracture. 4. Grade 1 anterolisthesis of C4 on C5 mediated by degenerative disc and facet arthropathy. Electronically Signed   By: Tollie Eth M.D.   On: 05/23/2017 23:00   Dg Humerus Left  Result Date: 05/23/2017 CLINICAL DATA:  Fall EXAM: LEFT HUMERUS - 2+ VIEW COMPARISON:  None. FINDINGS: Acute fracture involving the proximal shaft of the humerus with moderate varus angulation of distal fracture fragment and about 1/2 bone with displacement of distal fracture fragment. Left humeral head projects over the glenoid. Mild overriding. IMPRESSION: Acute displaced and angulated proximal humerus fracture Electronically Signed   By: Jasmine Pang M.D.   On: 05/23/2017 23:12   Dg Hips Bilat With Pelvis Min 5 Views  Result Date: 05/23/2017 CLINICAL DATA:  Fall EXAM: DG HIP (WITH OR WITHOUT PELVIS) 5+V BILAT COMPARISON:  None. FINDINGS: Mild SI joint degenerative changes. Right femoral head projects in joint.  Possible old right superior pubic ramus fracture. Proximal left femur is intact. Acute fractures involving the left superior and inferior pubic rami. IMPRESSION: 1. Acute minimally displaced fractures involving the left superior and inferior pubic rami 2. No definite acute osseous abnormality of the right hip Electronically Signed   By: Jasmine Pang M.D.   On: 05/23/2017 23:14    EKG: Independently reviewed.  Normal intervals, Q waves in inferior leads, 2 3 aVF and lateral leads V5 V6.  T wave inversions in non contiguous leads - aVL V2.   No old EKG to compare  Assessment/Plan Principal Problem:   Closed fracture of left proximal humerus Active Problems:   Left rib fracture   Pubic ramus fracture, left, closed, initial encounter [facility])   Pelvic fracture (HCC)   Fall-with subsequent superior and inferior pubic rami fracture.  Fall likely 2/2 Orthostatic hypotension- chronic. No cardiac history.  EKG shows Q waves.i stat trop negative. Chest x-ray negative for acute abnormality. Recent poor Po intake, 1 episode of vomiting in the setting of likely viral URTI.  Heads and cervical CT-scalp contusion, no fracture, cerebral atrophy, anterolisthesis c4-c5. -Will r/o ACS with troponins x3 -EKG a.m. - Monitor on tele for now. -Cannot check orthostatic vitals due to pelvic fracture, gentle hydration normal saline 75 cc/h for 12 hours -Check UA -Foley catheter consult  Left Rib fracture, fracture of pubic rami,humeral fracture- 2/2 fall. -Incentative spirometry -Controlled morphine IV 2 mg every 4 hourly as needed -Ortho recommendations-weightbearing as tolerated for the pubic ramus fractures, sling immobilizer for humeral fracture repeat x-ray of left humerus tomorrow to see whether gravity can improve position.  Will wait till Sunday to make final decision.  Upper respiratory tract infection-congestion. chest x-ray clear.  WBC 14.  Took first dose of azithromycin today. -Hold azithromycin for now with clear chest x-ray  Memory problems-likely dementia. Hx of patient pocketing food.  Barium swallow 02/2017-mild aspiration risk. No falls in >8 yrs. -IV medications for now  HTN-blood pressure initially elevated systolic 200s, likely from pain.  Medication Norvasc 2.5 mg daily -Hold PO Norvasc for now -PRN IV labetalol  CKD 4- Cr- 1.58. Per care everywhere last Cr 10/12/16- 1.8. - Gentle hydration  Leukocytosis- WBC- 14. Recent fall ?exact etiology. Chest xray clear. - get UA - CBC a.m   DVT prophylaxis: SCDs Code Status:  Full Family Communication:Daughter susan and spouse present at bedside  disposition Plan: >2 days Consults called: Orthopedics Admission status: inpt, tele   Onnie Boer MD [facility] Pager [phone] From 6PM-2AM.  Otherwise please contact night-coverage [email or web address] Password [email or web address]  05/24/2017, 1:22 AM

## 2017-05-25 ENCOUNTER — Encounter (HOSPITAL_COMMUNITY)
Admission: EM | Disposition: A | Discharge status: Skilled Nursing Facility | Payer: Self-pay | Source: Home / Self Care | Attending: Internal Medicine

## 2017-05-25 ENCOUNTER — Encounter (HOSPITAL_COMMUNITY): Payer: Self-pay | Admitting: Anesthesiology

## 2017-05-25 ENCOUNTER — Inpatient Hospital Stay (HOSPITAL_COMMUNITY): Payer: Medicare Other

## 2017-05-25 DIAGNOSIS — I340 Nonrheumatic mitral (valve) insufficiency: Secondary | ICD-10-CM

## 2017-05-25 DIAGNOSIS — W19XXXA Unspecified fall, initial encounter: Secondary | ICD-10-CM

## 2017-05-25 LAB — BASIC METABOLIC PANEL
Anion gap: 9 (ref 5–15)
BUN: 28 mg/dL — ABNORMAL HIGH (ref 6–20)
CHLORIDE: 109 mmol/L (ref 101–111)
CO2: 22 mmol/L (ref 22–32)
CREATININE: 1.77 mg/dL — AB (ref 0.44–1.00)
Calcium: 7.2 mg/dL — ABNORMAL LOW (ref 8.9–10.3)
GFR calc non Af Amer: 23 mL/min — ABNORMAL LOW (ref >60)
GFR, EST AFRICAN AMERICAN: 27 mL/min — AB (ref >60)
Glucose, Bld: 98 mg/dL (ref 65–99)
POTASSIUM: 4 mmol/L (ref 3.5–5.1)
Sodium: 140 mmol/L (ref 135–145)

## 2017-05-25 LAB — GLUCOSE, CAPILLARY: Glucose-Capillary: 117 mg/dL — ABNORMAL HIGH (ref 65–99)

## 2017-05-25 LAB — CBC
HEMATOCRIT: 22.6 % — AB (ref 36.0–46.0)
HEMOGLOBIN: 7.2 g/dL — AB (ref 12.0–15.0)
MCH: 31.4 pg (ref 26.0–34.0)
MCHC: 31.9 g/dL (ref 30.0–36.0)
MCV: 98.7 fL (ref 78.0–100.0)
Platelets: 164 10*3/uL (ref 150–400)
RBC: 2.29 MIL/uL — ABNORMAL LOW (ref 3.87–5.11)
RDW: 13.6 % (ref 11.5–15.5)
WBC: 10.7 10*3/uL — ABNORMAL HIGH (ref 4.0–10.5)

## 2017-05-25 LAB — ABO/RH
ABO/RH(D): O POS
ABO/RH(D): O POS

## 2017-05-25 LAB — ECHOCARDIOGRAM COMPLETE
Height: 61 in
Weight: 1840 oz

## 2017-05-25 LAB — TYPE AND SCREEN
ABO/RH(D): O POS
Antibody Screen: NEGATIVE

## 2017-05-25 LAB — SURGICAL PCR SCREEN
MRSA, PCR: NEGATIVE
STAPHYLOCOCCUS AUREUS: NEGATIVE

## 2017-05-25 LAB — PREPARE RBC (CROSSMATCH)

## 2017-05-25 SURGERY — OPEN REDUCTION INTERNAL FIXATION (ORIF) SHOULDER FRACTURE
Anesthesia: Choice | Laterality: Left

## 2017-05-25 MED ORDER — HYDROCODONE-ACETAMINOPHEN 5-325 MG PO TABS
1.0000 | ORAL_TABLET | Freq: Four times a day (QID) | ORAL | Status: DC | PRN
  Administered 2017-05-27: 1 via ORAL
  Filled 2017-05-25 (×2): qty 1

## 2017-05-25 MED ORDER — LEVOTHYROXINE SODIUM 50 MCG PO TABS: 50.0000 ug | ORAL_TABLET | Freq: Every day | ORAL | Status: DC

## 2017-05-25 MED ORDER — SODIUM CHLORIDE 0.9 % IV SOLN: Freq: Once | INTRAVENOUS | Status: DC

## 2017-05-25 MED ORDER — FUROSEMIDE 10 MG/ML IJ SOLN
20.0000 mg | Freq: Once | INTRAMUSCULAR | Status: AC
  Administered 2017-05-25: 20 mg via INTRAVENOUS
  Filled 2017-05-25: qty 2

## 2017-05-25 MED ORDER — HYDRALAZINE HCL 20 MG/ML IJ SOLN
10.0000 mg | Freq: Three times a day (TID) | INTRAMUSCULAR | Status: DC | PRN
  Administered 2017-05-25 – 2017-05-26 (×2): 10 mg via INTRAVENOUS
  Filled 2017-05-25 (×2): qty 1

## 2017-05-25 MED ORDER — LORAZEPAM 2 MG/ML IJ SOLN: 0.5000 mg | Freq: Once | INTRAMUSCULAR | Status: DC | PRN

## 2017-05-25 MED ORDER — ACETAMINOPHEN 10 MG/ML IV SOLN: 1000.0000 mg | Freq: Four times a day (QID) | INTRAVENOUS | Status: DC | PRN

## 2017-05-25 MED ORDER — MORPHINE SULFATE (PF) 2 MG/ML IV SOLN
1.0000 mg | INTRAVENOUS | Status: DC | PRN
Start: 2017-05-25 — End: 2017-05-25

## 2017-05-25 MED ORDER — SERTRALINE HCL 50 MG PO TABS
25.0000 mg | ORAL_TABLET | Freq: Every day | ORAL | Status: DC
  Administered 2017-05-28 – 2017-06-03 (×8): 25 mg via ORAL
  Filled 2017-05-25 (×8): qty 1

## 2017-05-25 MED ORDER — SODIUM CHLORIDE 0.9 % IV SOLN: Freq: Once | INTRAVENOUS | Status: AC

## 2017-05-25 MED ORDER — HALOPERIDOL LACTATE 5 MG/ML IJ SOLN: 1.0000 mg | Freq: Once | INTRAMUSCULAR | Status: DC | PRN

## 2017-05-25 MED ORDER — SODIUM CHLORIDE 0.9 % IV BOLUS
500.0000 mL | Freq: Once | INTRAVENOUS | Status: AC
  Administered 2017-05-25: 500 mL via INTRAVENOUS

## 2017-05-25 MED ORDER — AMLODIPINE BESYLATE 5 MG PO TABS
2.5000 mg | ORAL_TABLET | Freq: Two times a day (BID) | ORAL | Status: DC
  Filled 2017-05-25: qty 1

## 2017-05-25 MED ORDER — MORPHINE SULFATE (PF) 2 MG/ML IV SOLN
0.5000 mg | INTRAVENOUS | Status: DC | PRN
  Administered 2017-05-25 – 2017-05-26 (×2): 0.5 mg via INTRAVENOUS
  Filled 2017-05-25 (×2): qty 1

## 2017-05-25 MED ORDER — WHITE PETROLATUM EX OINT
TOPICAL_OINTMENT | CUTANEOUS | Status: AC
  Filled 2017-05-25: qty 28.35

## 2017-05-25 MED ORDER — DIPHENHYDRAMINE HCL 25 MG PO CAPS: 25.0000 mg | ORAL_CAPSULE | Freq: Once | ORAL | Status: DC

## 2017-05-25 MED ORDER — DEXTROSE 5 % IV SOLN
INTRAVENOUS | Status: DC
  Administered 2017-05-25 – 2017-05-26 (×2): via INTRAVENOUS

## 2017-05-25 MED ORDER — ACETAMINOPHEN 325 MG PO TABS: 650.0000 mg | ORAL_TABLET | Freq: Once | ORAL | Status: DC

## 2017-05-25 MED ORDER — LEVOTHYROXINE SODIUM 100 MCG IV SOLR
25.0000 ug | Freq: Every day | INTRAVENOUS | Status: DC
  Administered 2017-05-28 – 2017-05-30 (×3): 25 ug via INTRAVENOUS
  Filled 2017-05-25 (×6): qty 5

## 2017-05-25 NOTE — Progress Notes (Addendum)
PROGRESS NOTE    Meagan Osborn  WUJ:[id number] DOB: [birth year] DOA: 05/23/2017 PCP: Barbie Banner, MD     Brief Narrative:  [name] a 84 y.o.femalewith medical history significantforasthma, CKD stage 4,progressive memory loss, who presented to the ED after patient sustained a fall at home.Patient lives with her daughter.Patient got up from chair and was walking towards the kitchen when she suddenly fell.Fall was unwitnessed. When daughter heard the fall and came to her aid, patient had not lost consciousness and no witnessed seizure activities. Daughter immediately called EMS. Patient's has chronic orthostatic dizziness,son-in-law suspects thismay her caused her fall, as patient had just gotten up from sitting position. Work up in the ED revealed multiple fractures including left pubic rami, left humerus, and left 6th rib. Orthopedic surgery has been consulted.   Assessment & Plan:   Principal Problem:   Closed fracture of left proximal humerus Active Problems:   Left rib fracture   Pubic ramus fracture, left, closed, initial encounter (HCC)   Pelvic fracture (HCC)   CKD (chronic kidney disease), stage IV (HCC)   Left proximal humerus fracture, left 6th rib fracture, pubic rami fracture s/p mechanical fall without loss of consciousness -Orthopedic surgery consulted, planning for OR this morning for fixation of left humerus -Cardiac risk using Chales Abrahams scale is 0.51% for estimated risk probability for perioperative myocardial infarction or cardiac arrest. This was discussed with daughter/HCPOA -Echocardiogram unable to complete yesterday due to patient's left-sided injuries and inability to tolerate position for adequate exam. Troponin has been 0.05-->0.04 without chest pain or EKG changes. -Will need significant rehab post-discharge  Addendum: spoke with Dr. Dion Saucier (ortho), Dr. Chilton Si (anesthesia), and Dr. Shirlee Latch (cardiology). Patient needs blood transfusions today  (Hgb 7.2) prior to surgical procedure and cardiac optimization. 2u pRBC has been ordered. Per my conversation with Dr. Chilton Si, patient is considered high risk for surgical procedure. She also has a systolic murmur on examination (per daughter, it is chronic and being monitored). I do not see any previous echocardiogram available in Epic or Care Everywhere. Dr. Chilton Si recommends TTE, cardiology consultation, and medical optimization prior to any surgical procedure. Recommending transfer to [facility] Spoke with Dr. Shirlee Latch, he asked for echocardiogram to be done and as patient has no previous cardiac history, if echo is normal, can proceed with surgery.  -Plan: Transfuse 2u pRBC. Follow up echocardiogram. Transfer to [facility]. Discussed with daughter. All questions answered.   CKD stage 4 -Baseline Cr in 2015-2017 has been 1.5-1.9 -Stable -Continue to monitor urine output  HTN -Continue norvasc  Hypothyroidism -Continue synthroid  Anxiety -Continue zoloft  Memory loss -Progressive memory loss at home, although quite functional and independent. No formal dx of dementia although family notes mentation decline over the past several months    DVT prophylaxis: SCD  Code Status: DNR Family Communication: Daughter/HCPOA at bedside Disposition Plan: Pending OR and PT OT    Consultants:   Orthopedic surgery  Procedures:   None   Antimicrobials:  Anti-infectives (From admission, onward)   Start     Dose/Rate Route Frequency Ordered Stop   05/25/17 0600  ceFAZolin (ANCEF) IVPB 2g/100 mL premix     2 g 200 mL/hr over 30 Minutes Intravenous On call to O.R. 05/24/17 2044 05/26/17 0559       Subjective: Patient awake this morning, moans. Denies chest pain.   Objective: Vitals:   05/24/17 1358 05/24/17 2050 05/25/17 0413 05/25/17 0755  BP: (!) 151/93 (!) 132/56 (!) 121/55  Pulse: 93 96 71   Resp: 14 18 18    Temp: (!) 97.5 F (36.4 C) 98.5 F (36.9 C)    TempSrc: Oral Oral     SpO2: 95% 95% 99% 95%  Weight:      Height:        Intake/Output Summary (Last 24 hours) at 05/25/2017 0802 Last data filed at 05/25/2017 0600 Gross per 24 hour  Intake 1572.5 ml  Output 225 ml  Net 1347.5 ml   Filed Weights   05/23/17 2145  Weight: 52.2 kg (115 lb)    Examination:  General exam: Appears calm and comfortable  Respiratory system: Clear to auscultation. Respiratory effort normal. Cardiovascular system: S1 & S2 heard, RRR. +systolic murmur, chronic per daughter  Gastrointestinal system: Abdomen is nondistended, soft and nontender. No organomegaly or masses felt. Normal bowel sounds heard. Central nervous system: Alert to voice  Extremities: +Left arm in sling  Skin: No rashes, lesions or ulcers Psychiatry: Hx of progressive memory loss   Data Reviewed: I have personally reviewed following labs and imaging studies  CBC: Recent Labs  Lab 05/23/17 2140 05/24/17 0545 05/25/17 0436  WBC 14.4* 14.0* 10.7*  NEUTROABS 11.6*  --   --   HGB 9.9* 8.9* 7.2*  HCT 30.6* 27.0* 22.6*  MCV 96.2 96.4 98.7  PLT 224 199 164   Basic Metabolic Panel: Recent Labs  Lab 05/23/17 2140 05/24/17 0544 05/25/17 0436  NA 138 138 140  K 4.1 4.3 4.0  CL 102 104 109  CO2 25 26 22   GLUCOSE 129* 122* 98  BUN 23* 25* 28*  CREATININE 1.58* 1.48* 1.77*  CALCIUM 8.4* 8.0* 7.2*   GFR: Estimated Creatinine Clearance: 14.7 mL/min (A) (by C-G formula based on SCr of 1.77 mg/dL (H)). Liver Function Tests: Recent Labs  Lab 05/23/17 2140  AST 19  ALT 18  ALKPHOS 52  BILITOT 0.6  PROT 6.3*  ALBUMIN 3.3*   No results for input(s): LIPASE, AMYLASE in the last 168 hours. No results for input(s): AMMONIA in the last 168 hours. Coagulation Profile: No results for input(s): INR, PROTIME in the last 168 hours. Cardiac Enzymes: Recent Labs  Lab 05/23/17 2140 05/24/17 0300 05/24/17 0930  TROPONINI 0.05* 0.05* 0.04*   BNP (last 3 results) No results for input(s): PROBNP in the  last 8760 hours. HbA1C: No results for input(s): HGBA1C in the last 72 hours. CBG: No results for input(s): GLUCAP in the last 168 hours. Lipid Profile: No results for input(s): CHOL, HDL, LDLCALC, TRIG, CHOLHDL, LDLDIRECT in the last 72 hours. Thyroid Function Tests: No results for input(s): TSH, T4TOTAL, FREET4, T3FREE, THYROIDAB in the last 72 hours. Anemia Panel: No results for input(s): VITAMINB12, FOLATE, FERRITIN, TIBC, IRON, RETICCTPCT in the last 72 hours. Sepsis Labs: No results for input(s): PROCALCITON, LATICACIDVEN in the last 168 hours.  Recent Results (from the past 240 hour(s))  Surgical pcr screen     Status: None   Collection Time: 05/24/17 10:26 PM  Result Value Ref Range Status   MRSA, PCR NEGATIVE NEGATIVE Final   Staphylococcus aureus NEGATIVE NEGATIVE Final    Comment: (NOTE) The Xpert SA Assay (FDA approved for NASAL specimens in patients 63 years of age and older), is one component of a comprehensive surveillance program. It is not intended to diagnose infection nor to guide or monitor treatment. Performed at [facility], [address], [place], [place] [address]        Radiology Studies: Dg Chest 1  View  Result Date: 05/23/2017 CLINICAL DATA:  Fall EXAM: CHEST  1 VIEW COMPARISON:  06/13/2016 FINDINGS: Mild cardiomegaly. No consolidation or pleural effusion. Calcified granuloma bilaterally. Aortic atherosclerosis. No pneumothorax. Acute displaced proximal left humerus fracture. Acute displaced left sixth rib fracture IMPRESSION: 1. Negative for pneumothorax or pleural effusion 2. Acute displaced left 6 rib fracture. Acute displaced proximal humerus fracture on the left 3. Mild cardiomegaly Electronically Signed   By: Jasmine Pang M.D.   On: 05/23/2017 23:16   Dg Shoulder 1v Left  Result Date: 05/24/2017 CLINICAL DATA:  Known proximal left humerus fracture EXAM: LEFT SHOULDER - 1 VIEW COMPARISON:  05/23/2017 humerus and CXR  radiographs FINDINGS: Acute left surgical neck fracture of the humerus with 6 mm of medial displacement of the humeral head is identified on current exam. The 45 degrees or so of angulation measured on prior study is less apparent on this single view. The acute displaced left sixth rib fracture seen on prior exams is obscured due to overlap on current study. IMPRESSION: 1. 6 mm of medial displacement of known acute surgical neck fracture of the left humerus is seen on this single frontal view of the left shoulder. The 45 degrees of angulation is not apparent on this study. 2. The acute minimally displaced left sixth rib fracture is also obscured on current exam. Electronically Signed   By: Tollie Eth M.D.   On: 05/24/2017 18:34   Ct Head Wo Contrast  Result Date: 05/23/2017 CLINICAL DATA:  Pain after head trauma. EXAM: CT HEAD WITHOUT CONTRAST CT CERVICAL SPINE WITHOUT CONTRAST TECHNIQUE: Multidetector CT imaging of the head and cervical spine was performed following the standard protocol without intravenous contrast. Multiplanar CT image reconstructions of the cervical spine were also generated. COMPARISON:  None. FINDINGS: CT HEAD FINDINGS Brain: Moderate chronic appearing small vessel ischemia with atrophy. Large vascular territory infarct, intra-axial mass nor extra-axial fluid collections. Midline fourth ventricle and basal cisterns. No effacement. Vascular: Atherosclerosis of the carotid siphons. No hyperdense vessel sign. Skull: No skull fracture or suspicious osseous lesions. Arachnoid granulations along the occiput of the skull. Sinuses/Orbits: Clear mastoids. Bilateral lens replacements. Ethmoid and sphenoid sinus mucosal thickening without air-fluid levels. Other: Left parietal scalp contusion. CT CERVICAL SPINE FINDINGS Alignment: Degenerative disc and facet mediated grade 1 anterolisthesis of C4 on C5 with marked disc space narrowing. Intact craniocervical relationship and atlantodental interval.  Skull base and vertebrae: Marked osteoarthritis of the atlantodental interval with sclerosis and joint space narrowing. No skull base nor cervical spine fracture. Degenerative subcortical cystic change of the odontoid process. Soft tissues and spinal canal: No prevertebral soft tissue swelling. No visible canal hematoma. Disc levels: Cervical spondylitic change most pronounced from C4 through C7. Lesser degree of disc space narrowing C2-3 and C3-4. Multilevel degenerative facet arthropathy, ankylosed at C5-6 on the right. Uncovertebral joint osteoarthritis from C3-4 on the right and bilaterally at C4-5, C5-6 and C6-7 contributing to mild foraminal encroachment at C5-6 and C6-7 bilaterally. Upper chest: Biapical pleuroparenchymal scarring. Other: Extracranial carotid arteriosclerosis bilaterally. IMPRESSION: 1. Left parietal scalp contusion without underlying skull fracture. 2. Cerebral atrophy with chronic appearing small vessel ischemia. 3. Cervical spondylosis without acute cervical spine fracture. 4. Grade 1 anterolisthesis of C4 on C5 mediated by degenerative disc and facet arthropathy. Electronically Signed   By: Tollie Eth M.D.   On: 05/23/2017 23:00   Ct Cervical Spine Wo Contrast  Result Date: 05/23/2017 CLINICAL DATA:  Pain after head trauma. EXAM: CT HEAD WITHOUT CONTRAST  CT CERVICAL SPINE WITHOUT CONTRAST TECHNIQUE: Multidetector CT imaging of the head and cervical spine was performed following the standard protocol without intravenous contrast. Multiplanar CT image reconstructions of the cervical spine were also generated. COMPARISON:  None. FINDINGS: CT HEAD FINDINGS Brain: Moderate chronic appearing small vessel ischemia with atrophy. Large vascular territory infarct, intra-axial mass nor extra-axial fluid collections. Midline fourth ventricle and basal cisterns. No effacement. Vascular: Atherosclerosis of the carotid siphons. No hyperdense vessel sign. Skull: No skull fracture or suspicious  osseous lesions. Arachnoid granulations along the occiput of the skull. Sinuses/Orbits: Clear mastoids. Bilateral lens replacements. Ethmoid and sphenoid sinus mucosal thickening without air-fluid levels. Other: Left parietal scalp contusion. CT CERVICAL SPINE FINDINGS Alignment: Degenerative disc and facet mediated grade 1 anterolisthesis of C4 on C5 with marked disc space narrowing. Intact craniocervical relationship and atlantodental interval. Skull base and vertebrae: Marked osteoarthritis of the atlantodental interval with sclerosis and joint space narrowing. No skull base nor cervical spine fracture. Degenerative subcortical cystic change of the odontoid process. Soft tissues and spinal canal: No prevertebral soft tissue swelling. No visible canal hematoma. Disc levels: Cervical spondylitic change most pronounced from C4 through C7. Lesser degree of disc space narrowing C2-3 and C3-4. Multilevel degenerative facet arthropathy, ankylosed at C5-6 on the right. Uncovertebral joint osteoarthritis from C3-4 on the right and bilaterally at C4-5, C5-6 and C6-7 contributing to mild foraminal encroachment at C5-6 and C6-7 bilaterally. Upper chest: Biapical pleuroparenchymal scarring. Other: Extracranial carotid arteriosclerosis bilaterally. IMPRESSION: 1. Left parietal scalp contusion without underlying skull fracture. 2. Cerebral atrophy with chronic appearing small vessel ischemia. 3. Cervical spondylosis without acute cervical spine fracture. 4. Grade 1 anterolisthesis of C4 on C5 mediated by degenerative disc and facet arthropathy. Electronically Signed   By: Tollie Ethavid  Kwon M.D.   On: 05/23/2017 23:00   Dg Humerus Left  Result Date: 05/23/2017 CLINICAL DATA:  Fall EXAM: LEFT HUMERUS - 2+ VIEW COMPARISON:  None. FINDINGS: Acute fracture involving the proximal shaft of the humerus with moderate varus angulation of distal fracture fragment and about 1/2 bone with displacement of distal fracture fragment. Left humeral  head projects over the glenoid. Mild overriding. IMPRESSION: Acute displaced and angulated proximal humerus fracture Electronically Signed   By: Jasmine PangKim  Fujinaga M.D.   On: 05/23/2017 23:12   Dg Hips Bilat With Pelvis Min 5 Views  Result Date: 05/23/2017 CLINICAL DATA:  Fall EXAM: DG HIP (WITH OR WITHOUT PELVIS) 5+V BILAT COMPARISON:  None. FINDINGS: Mild SI joint degenerative changes. Right femoral head projects in joint. Possible old right superior pubic ramus fracture. Proximal left femur is intact. Acute fractures involving the left superior and inferior pubic rami. IMPRESSION: 1. Acute minimally displaced fractures involving the left superior and inferior pubic rami 2. No definite acute osseous abnormality of the right hip Electronically Signed   By: Jasmine PangKim  Fujinaga M.D.   On: 05/23/2017 23:14      Scheduled Meds: . amLODipine  2.5 mg Oral BID  . chlorhexidine  60 mL Topical Once  . diclofenac sodium  2 g Topical QID  . feeding supplement (ENSURE ENLIVE)  237 mL Oral BID BM  . levothyroxine  50 mcg Oral QAC breakfast  . loratadine  10 mg Oral Daily  . mometasone-formoterol  2 puff Inhalation BID  . povidone-iodine  2 application Topical Once  . sertraline  25 mg Oral Daily  . vitamin A & D       Continuous Infusions: . sodium chloride 75 mL/hr at 05/25/17 0415  .  ceFAZolin (ANCEF) IV    . sodium chloride       LOS: 1 day    Time spent: 35 minutes   Noralee Stain, DO [facility] [email or web address] Password [email or web address] 05/25/2017, 8:02 AM

## 2017-05-25 NOTE — Progress Notes (Signed)
Report given to Britt BologneseAnisha Mabe, RN at [facility]. Pt will be transported Via Care link.

## 2017-05-25 NOTE — Progress Notes (Signed)
  Echocardiogram 2D Echocardiogram has been performed.  Alireza Pollack T Glendene Wyer 05/25/2017, 9:48 AM

## 2017-05-25 NOTE — Progress Notes (Signed)
Patient is unable to swallow medications. MD notified of this issue and made aware. Will continue to monitor.

## 2017-05-25 NOTE — Progress Notes (Signed)
  Acceptance Note  Patient seen and examined. Patient is lethargic, she open her eyes . She moves right arm passively. [facility]  Family at bedside report, patient has had problems with swallowing pills and some food at home. She hold food in her mouth. Patient does not have a diagnosed of dementia.   1-AMS; patient more sleepy, suspect related to medications. Received IV morphine today, baclofen yesterday. Hydrocodone yesterday.  Hold on sedatives. Will order IV tylenol for pain.  CBG was check and was normal.  Hold oral medications. Change synthroid to IV, hold Norvasc. Will order IV hydralazine.  Nurse will do frequent vital check.   2-HTN; hold Norvasc. IV hydralazin PRN.   3-Hypothyroidism; Change synthroid to IV.   4-Anemia; patient to received Blood transfusion. Will discontinue benadryl that was order before Blood transfusion.   5-Dysphagia; had Barium swallow out patient which was normal. Will need speech eval when more alert.  Check B 12, TSH.

## 2017-05-25 NOTE — Progress Notes (Signed)
Patient has had 100cc output since 7pm. On call notified and new orders were given for a one time 500cc bolus dose and to bladder scan patient. Bladder scan showed 0cc. Will continue to monitor.

## 2017-05-25 NOTE — Progress Notes (Signed)
New Admission Note:   Arrival Method: Stretcher Mental Orientation: A&O X3 Telemetry: Initiated Assessment: Completed Skin: See Flowsheets IV: WDL  Pain: Patient is moaning in pain Tubes: Foley Catheter WDL Safety Measures: Safety Fall Prevention Plan has been given, discussed and signed Admission: Completed Unit Orientation: Patient has been orientated to the room, unit and staff.  Family: At bedside  Orders have been reviewed and implemented. Will continue to monitor the patient. Call light has been placed within reach and bed alarm has been activated.    Britt BologneseAnisha Mabe RN, BSN

## 2017-05-25 NOTE — Progress Notes (Signed)
Seen and examined patient.  Patient is asleep.  Seems to be comfortable.  Her hemoglobin was 7, I have ordered 2 units of packed red blood cells, and discussed with the family.  I have coordinated with anesthesia, as well as the internal medicine team, and she requires more in-depth cardiac evaluation, echocardiogram has been performed.  We will plan [facility] to [facility], transfuse 2 units packed red blood cells, evaluation by cardiology, if she is optimized then plan for surgery tomorrow afternoon.  Discussed with nursing staff, patient's family, and medical physicians.

## 2017-05-26 ENCOUNTER — Inpatient Hospital Stay (HOSPITAL_COMMUNITY): Payer: Medicare Other | Admitting: Anesthesiology

## 2017-05-26 ENCOUNTER — Inpatient Hospital Stay (HOSPITAL_COMMUNITY): Payer: Medicare Other

## 2017-05-26 ENCOUNTER — Encounter (HOSPITAL_COMMUNITY): Payer: Self-pay | Admitting: Anesthesiology

## 2017-05-26 ENCOUNTER — Inpatient Hospital Stay: Admit: 2017-05-26 | Payer: Medicare Other | Admitting: Orthopedic Surgery

## 2017-05-26 ENCOUNTER — Encounter (HOSPITAL_COMMUNITY)
Admission: EM | Disposition: A | Discharge status: Skilled Nursing Facility | Payer: Self-pay | Source: Home / Self Care | Attending: Internal Medicine

## 2017-05-26 DIAGNOSIS — S32592A Other specified fracture of left pubis, initial encounter for closed fracture: Secondary | ICD-10-CM

## 2017-05-26 HISTORY — PX: ORIF HUMERUS FRACTURE: SHX2126

## 2017-05-26 LAB — BASIC METABOLIC PANEL
ANION GAP: 11 (ref 5–15)
BUN: 29 mg/dL — ABNORMAL HIGH (ref 6–20)
CALCIUM: 8 mg/dL — AB (ref 8.9–10.3)
CO2: 22 mmol/L (ref 22–32)
Chloride: 108 mmol/L (ref 101–111)
Creatinine, Ser: 1.88 mg/dL — ABNORMAL HIGH (ref 0.44–1.00)
GFR calc non Af Amer: 22 mL/min — ABNORMAL LOW (ref >60)
GFR, EST AFRICAN AMERICAN: 25 mL/min — AB (ref >60)
Glucose, Bld: 128 mg/dL — ABNORMAL HIGH (ref 65–99)
POTASSIUM: 4 mmol/L (ref 3.5–5.1)
SODIUM: 141 mmol/L (ref 135–145)

## 2017-05-26 LAB — CBC
HCT: 31.8 % — ABNORMAL LOW (ref 36.0–46.0)
Hemoglobin: 10.3 g/dL — ABNORMAL LOW (ref 12.0–15.0)
MCH: 30.3 pg (ref 26.0–34.0)
MCHC: 32.4 g/dL (ref 30.0–36.0)
MCV: 93.5 fL (ref 78.0–100.0)
PLATELETS: 198 10*3/uL (ref 150–400)
RBC: 3.4 MIL/uL — AB (ref 3.87–5.11)
RDW: 16.6 % — AB (ref 11.5–15.5)
WBC: 15.5 10*3/uL — AB (ref 4.0–10.5)

## 2017-05-26 LAB — URINALYSIS, MICROSCOPIC (REFLEX): RBC / HPF: NONE SEEN RBC/hpf (ref 0–5)

## 2017-05-26 LAB — URINALYSIS, COMPLETE (UACMP) WITH MICROSCOPIC
Bilirubin Urine: NEGATIVE
Glucose, UA: NEGATIVE mg/dL
Ketones, ur: NEGATIVE mg/dL
NITRITE: NEGATIVE
Specific Gravity, Urine: 1.025 (ref 1.005–1.030)
pH: 5 (ref 5.0–8.0)

## 2017-05-26 LAB — TRANSFUSION REACTION
DAT C3: NEGATIVE
Post RXN DAT IgG: NEGATIVE

## 2017-05-26 LAB — VITAMIN B12: VITAMIN B 12: 195 pg/mL (ref 180–914)

## 2017-05-26 LAB — TSH: TSH: 4.152 u[IU]/mL (ref 0.350–4.500)

## 2017-05-26 SURGERY — OPEN REDUCTION INTERNAL FIXATION (ORIF) PROXIMAL HUMERUS FRACTURE
Anesthesia: General | Laterality: Left

## 2017-05-26 MED ORDER — ONDANSETRON HCL 4 MG/2ML IJ SOLN
INTRAMUSCULAR | Status: DC | PRN
  Administered 2017-05-26: 4 mg via INTRAVENOUS

## 2017-05-26 MED ORDER — ENOXAPARIN SODIUM 30 MG/0.3ML ~~LOC~~ SOLN
30.0000 mg | SUBCUTANEOUS | Status: DC
  Administered 2017-05-27 – 2017-06-03 (×8): 30 mg via SUBCUTANEOUS
  Filled 2017-05-26 (×8): qty 0.3

## 2017-05-26 MED ORDER — SUGAMMADEX SODIUM 200 MG/2ML IV SOLN
INTRAVENOUS | Status: DC | PRN
  Administered 2017-05-26: 200 mg via INTRAVENOUS

## 2017-05-26 MED ORDER — ROCURONIUM BROMIDE 100 MG/10ML IV SOLN
INTRAVENOUS | Status: DC | PRN
  Administered 2017-05-26: 25 mg via INTRAVENOUS

## 2017-05-26 MED ORDER — POTASSIUM CHLORIDE IN NACL 20-0.45 MEQ/L-% IV SOLN
INTRAVENOUS | Status: DC
Start: 2017-05-26 — End: 2017-05-28
  Administered 2017-05-26 – 2017-05-27 (×2): via INTRAVENOUS
  Filled 2017-05-26 (×4): qty 1000

## 2017-05-26 MED ORDER — FENTANYL CITRATE (PF) 250 MCG/5ML IJ SOLN
INTRAMUSCULAR | Status: AC
  Filled 2017-05-26: qty 5

## 2017-05-26 MED ORDER — FENTANYL CITRATE (PF) 100 MCG/2ML IJ SOLN
25.0000 ug | Freq: Once | INTRAMUSCULAR | Status: AC
  Administered 2017-05-26: 25 ug via INTRAVENOUS

## 2017-05-26 MED ORDER — MAGNESIUM CITRATE PO SOLN: 1.0000 | Freq: Once | ORAL | Status: DC | PRN

## 2017-05-26 MED ORDER — DOCUSATE SODIUM 100 MG PO CAPS
100.0000 mg | ORAL_CAPSULE | Freq: Two times a day (BID) | ORAL | Status: DC
  Filled 2017-05-26: qty 1

## 2017-05-26 MED ORDER — FENTANYL CITRATE (PF) 100 MCG/2ML IJ SOLN
INTRAMUSCULAR | Status: AC
  Administered 2017-05-26: 25 ug via INTRAVENOUS
  Filled 2017-05-26: qty 2

## 2017-05-26 MED ORDER — CEFAZOLIN SODIUM-DEXTROSE 2-4 GM/100ML-% IV SOLN
INTRAVENOUS | Status: AC
  Filled 2017-05-26: qty 100

## 2017-05-26 MED ORDER — LACTATED RINGERS IV SOLN
INTRAVENOUS | Status: DC
  Administered 2017-05-26 (×2): via INTRAVENOUS

## 2017-05-26 MED ORDER — CEFAZOLIN SODIUM-DEXTROSE 1-4 GM/50ML-% IV SOLN
1.0000 g | Freq: Four times a day (QID) | INTRAVENOUS | Status: AC
  Administered 2017-05-26 – 2017-05-27 (×3): 1 g via INTRAVENOUS
  Filled 2017-05-26 (×3): qty 50

## 2017-05-26 MED ORDER — FENTANYL CITRATE (PF) 100 MCG/2ML IJ SOLN
INTRAMUSCULAR | Status: DC | PRN
  Administered 2017-05-26: 25 ug via INTRAVENOUS

## 2017-05-26 MED ORDER — ONDANSETRON HCL 4 MG/2ML IJ SOLN
4.0000 mg | Freq: Four times a day (QID) | INTRAMUSCULAR | Status: DC | PRN
  Administered 2017-05-27 – 2017-06-02 (×4): 4 mg via INTRAVENOUS
  Filled 2017-05-26 (×4): qty 2

## 2017-05-26 MED ORDER — ONDANSETRON HCL 4 MG/2ML IJ SOLN
INTRAMUSCULAR | Status: AC
  Administered 2017-05-26: 4 mg via INTRAVENOUS
  Filled 2017-05-26: qty 2

## 2017-05-26 MED ORDER — POLYETHYLENE GLYCOL 3350 17 G PO PACK
17.0000 g | PACK | Freq: Every day | ORAL | Status: DC | PRN
  Filled 2017-05-26: qty 1

## 2017-05-26 MED ORDER — CYANOCOBALAMIN 1000 MCG/ML IJ SOLN: 1000.0000 ug | Freq: Once | INTRAMUSCULAR | Status: DC

## 2017-05-26 MED ORDER — PHENOL 1.4 % MT LIQD: 1.0000 | OROMUCOSAL | Status: DC | PRN

## 2017-05-26 MED ORDER — FENTANYL CITRATE (PF) 100 MCG/2ML IJ SOLN
25.0000 ug | INTRAMUSCULAR | Status: DC | PRN
  Administered 2017-05-26 (×2): 25 ug via INTRAVENOUS

## 2017-05-26 MED ORDER — MIDAZOLAM HCL 2 MG/2ML IJ SOLN
INTRAMUSCULAR | Status: AC
  Filled 2017-05-26: qty 2

## 2017-05-26 MED ORDER — PHENYLEPHRINE HCL 10 MG/ML IJ SOLN
INTRAVENOUS | Status: DC | PRN
  Administered 2017-05-26: 50 ug/min via INTRAVENOUS

## 2017-05-26 MED ORDER — ONDANSETRON HCL 4 MG/2ML IJ SOLN
4.0000 mg | Freq: Once | INTRAMUSCULAR | Status: AC | PRN
  Administered 2017-05-26: 4 mg via INTRAVENOUS

## 2017-05-26 MED ORDER — ALUM & MAG HYDROXIDE-SIMETH 200-200-20 MG/5ML PO SUSP: 30.0000 mL | ORAL | Status: DC | PRN

## 2017-05-26 MED ORDER — PROPOFOL 10 MG/ML IV BOLUS
INTRAVENOUS | Status: DC | PRN
  Administered 2017-05-26: 60 mg via INTRAVENOUS

## 2017-05-26 MED ORDER — HYDRALAZINE HCL 20 MG/ML IJ SOLN
10.0000 mg | INTRAMUSCULAR | Status: DC | PRN
  Administered 2017-05-26 – 2017-06-01 (×6): 10 mg via INTRAVENOUS
  Filled 2017-05-26 (×8): qty 1

## 2017-05-26 MED ORDER — 0.9 % SODIUM CHLORIDE (POUR BTL) OPTIME
TOPICAL | Status: DC | PRN
  Administered 2017-05-26: 1000 mL

## 2017-05-26 MED ORDER — BUPIVACAINE-EPINEPHRINE (PF) 0.25% -1:200000 IJ SOLN
INTRAMUSCULAR | Status: AC
  Filled 2017-05-26: qty 30

## 2017-05-26 MED ORDER — MENTHOL 3 MG MT LOZG: 1.0000 | LOZENGE | OROMUCOSAL | Status: DC | PRN

## 2017-05-26 MED ORDER — CEFAZOLIN SODIUM-DEXTROSE 2-3 GM-%(50ML) IV SOLR
INTRAVENOUS | Status: DC | PRN
  Administered 2017-05-26: 2 g via INTRAVENOUS

## 2017-05-26 MED ORDER — ROPIVACAINE HCL 5 MG/ML IJ SOLN
INTRAMUSCULAR | Status: DC | PRN
  Administered 2017-05-26: 30 mL via PERINEURAL

## 2017-05-26 MED ORDER — ONDANSETRON HCL 4 MG PO TABS: 4.0000 mg | ORAL_TABLET | Freq: Four times a day (QID) | ORAL | Status: DC | PRN

## 2017-05-26 MED ORDER — METHOCARBAMOL 1000 MG/10ML IJ SOLN
250.0000 mg | Freq: Three times a day (TID) | INTRAMUSCULAR | Status: DC | PRN
  Filled 2017-05-26: qty 2.5

## 2017-05-26 MED ORDER — BISACODYL 10 MG RE SUPP: 10.0000 mg | Freq: Every day | RECTAL | Status: DC | PRN

## 2017-05-26 MED ORDER — LIDOCAINE HCL (CARDIAC) 20 MG/ML IV SOLN
INTRAVENOUS | Status: DC | PRN
  Administered 2017-05-26: 50 mg via INTRAVENOUS

## 2017-05-26 SURGICAL SUPPLY — 62 items
BIT DRILL 3.2 (BIT) ×3
BIT DRILL 3.2XCALB NS DISP (BIT) IMPLANT
BIT DRILL 67X1.5XWRPS STRL (BIT) IMPLANT
BIT DRILL CALIBRATED 2.7 (BIT) ×1 IMPLANT
BIT DRILL CALIBRATED 2.7MM (BIT) ×1
BIT DRL 3.2XCALB NS DISP (BIT) ×1
BIT DRL 67X1.5XWRPS STRL (BIT) ×1
CLEANER TIP ELECTROSURG 2X2 (MISCELLANEOUS) ×2 IMPLANT
CLOSURE STERI-STRIP 1/2X4 (GAUZE/BANDAGES/DRESSINGS) ×1
CLSR STERI-STRIP ANTIMIC 1/2X4 (GAUZE/BANDAGES/DRESSINGS) ×2 IMPLANT
COVER SURGICAL LIGHT HANDLE (MISCELLANEOUS) ×3 IMPLANT
DRAPE C-ARM 42X72 X-RAY (DRAPES) ×3 IMPLANT
DRAPE IMP U-DRAPE 54X76 (DRAPES) ×3 IMPLANT
DRAPE INCISE IOBAN 66X45 STRL (DRAPES) ×3 IMPLANT
DRAPE U-SHAPE 47X51 STRL (DRAPES) ×3 IMPLANT
DRILL BIT WIRE PASS (BIT) ×3
DRSG MEPILEX BORDER 4X8 (GAUZE/BANDAGES/DRESSINGS) ×2 IMPLANT
DURAPREP 26ML APPLICATOR (WOUND CARE) ×3 IMPLANT
ELECT REM PT RETURN 9FT ADLT (ELECTROSURGICAL) ×3
ELECTRODE REM PT RTRN 9FT ADLT (ELECTROSURGICAL) IMPLANT
GLOVE BIOGEL PI ORTHO PRO SZ8 (GLOVE) ×2
GLOVE ORTHO TXT STRL SZ7.5 (GLOVE) ×6 IMPLANT
GLOVE PI ORTHO PRO STRL SZ8 (GLOVE) ×1 IMPLANT
GLOVE SURG ORTHO 8.0 STRL STRW (GLOVE) ×6 IMPLANT
GOWN STRL REUS W/ TWL LRG LVL3 (GOWN DISPOSABLE) IMPLANT
GOWN STRL REUS W/TWL LRG LVL3 (GOWN DISPOSABLE)
K-WIRE 2X5 SS THRDED S3 (WIRE) ×9
KIT BASIN OR (CUSTOM PROCEDURE TRAY) ×3 IMPLANT
KIT TURNOVER KIT B (KITS) ×3 IMPLANT
KWIRE 2X5 SS THRDED S3 (WIRE) IMPLANT
NDL HYPO 25GX1X1/2 BEV (NEEDLE) IMPLANT
NEEDLE HYPO 25GX1X1/2 BEV (NEEDLE) IMPLANT
NS IRRIG 1000ML POUR BTL (IV SOLUTION) ×3 IMPLANT
PACK SHOULDER (CUSTOM PROCEDURE TRAY) ×3 IMPLANT
PACK UNIVERSAL I (CUSTOM PROCEDURE TRAY) ×3 IMPLANT
PAD ARMBOARD 7.5X6 YLW CONV (MISCELLANEOUS) ×6 IMPLANT
PEG LOCKING 3.2MMX44 (Peg) ×2 IMPLANT
PEG LOCKING 3.2X32 (Peg) ×2 IMPLANT
PEG LOCKING 3.2X34 (Screw) ×2 IMPLANT
PEG LOCKING 3.2X36 (Screw) ×4 IMPLANT
PEG LOCKING 3.2X42 (Screw) ×2 IMPLANT
PEG LOCKING 3.2X50 (Screw) ×2 IMPLANT
PLATE PROX HUMERUS 4H LEFT LOW (Plate) ×2 IMPLANT
SCREW LOCK CORT STAR 3.5X22 (Screw) ×4 IMPLANT
SCREW LP NL T15 3.5X22 (Screw) ×2 IMPLANT
SCREW LP NL T15 3.5X24 (Screw) ×2 IMPLANT
SPONGE LAP 4X18 X RAY DECT (DISPOSABLE) ×2 IMPLANT
STAPLER VISISTAT 35W (STAPLE) ×2 IMPLANT
SUCTION FRAZIER HANDLE 10FR (MISCELLANEOUS) ×2
SUCTION TUBE FRAZIER 10FR DISP (MISCELLANEOUS) ×1 IMPLANT
SUPPORT WRAP ARM LG (MISCELLANEOUS) ×1 IMPLANT
SUT FIBERWIRE #2 38 T-5 BLUE (SUTURE) ×6
SUT VIC AB 0 CTB1 27 (SUTURE) ×2 IMPLANT
SUT VIC AB 2-0 CT1 27 (SUTURE) ×6
SUT VIC AB 2-0 CT1 TAPERPNT 27 (SUTURE) ×1 IMPLANT
SUT VIC AB 3-0 FS2 27 (SUTURE) ×3 IMPLANT
SUTURE FIBERWR #2 38 T-5 BLUE (SUTURE) IMPLANT
SYR BULB IRRIGATION 50ML (SYRINGE) ×3 IMPLANT
SYR CONTROL 10ML LL (SYRINGE) IMPLANT
TOWEL OR 17X24 6PK STRL BLUE (TOWEL DISPOSABLE) ×3 IMPLANT
TOWEL OR 17X26 10 PK STRL BLUE (TOWEL DISPOSABLE) ×3 IMPLANT
WATER STERILE IRR 1000ML POUR (IV SOLUTION) ×3 IMPLANT

## 2017-05-26 NOTE — Anesthesia Procedure Notes (Signed)
Anesthesia Regional Block: Interscalene brachial plexus block   Pre-Anesthetic Checklist: ,, timeout performed, Correct Patient, Correct Site, Correct Laterality, Correct Procedure, Correct Position, site marked, Risks and benefits discussed, at surgeon's request and post-op pain management  Laterality: Upper and Left  Prep: Betadine, chloraprep, alcohol swabs       Needles:  Injection technique: Single-shot  Needle Type: Echogenic Needle     Needle Length: 5cm  Needle Gauge: 22     Additional Needles:   Procedures:, nerve stimulator,,,,,,,   Nerve Stimulator or Paresthesia:  Response: Twitch elicited, 0.5 mA, 0.3 ms,   Additional Responses:   Narrative:  Start time: 05/26/2017 1:52 PM End time: 05/26/2017 2:02 PM  Performed by: Personally  Anesthesiologist: Trevor IhaHouser, Taniesha Glanz A, MD

## 2017-05-26 NOTE — Progress Notes (Signed)
PROGRESS NOTE    Meagan Osborn  UJW:[id number] DOB: 1920-03-03 DOA: 05/23/2017 PCP: Barbie Banner, MD     Brief Narrative:  [name] a 84 y.o.femalewith medical history significantforasthma, CKD stage 4,progressive memory loss, who presented to the ED after patient sustained a fall at home.Patient lives with her daughter.Patient got up from chair and was walking towards the kitchen when she suddenly fell.Fall was unwitnessed. When daughter heard the fall and came to her aid, patient had not lost consciousness and no witnessed seizure activities. Daughter immediately called EMS. Patient's has chronic orthostatic dizziness,son-in-law suspects thismay her caused her fall, as patient had just gotten up from sitting position. Work up in the ED revealed multiple fractures including left pubic rami, left humerus, and left 6th rib. Orthopedic surgery has been consulted.   Assessment & Plan:   Principal Problem:   Closed fracture of left proximal humerus Active Problems:   Left rib fracture   Pubic ramus fracture, left, closed, initial encounter (HCC)   Pelvic fracture (HCC)   CKD (chronic kidney disease), stage IV (HCC)   Left proximal humerus fracture, left 6th rib fracture, pubic rami fracture s/p mechanical fall without loss of consciousness -Orthopedic surgery consulted, planning for OR 4/15 -Cardiac risk using Chales Abrahams scale is 0.51% for estimated risk probability for perioperative myocardial infarction or cardiac arrest. This was discussed with daughter/HCPOA -Will need significant rehab post-discharge -s/p Transfuse 2u pRBC with ? Reaction of elevated BP-- having to check BP on legs so not sure how accurate -echo: - Normal LV size with mild LV hypertrophy. EF 55-60%. Moderate   diastolic dysfunction. Normal RV size and systolic function.   Aortic valve sclerosis without significant stenosis. Calcified   mitral valve and annulus. There was mild mitral regurgitation  and   probably mild mitral stenosis. Moderate pulmonary hypertension.  CKD stage 4 -Baseline Cr in 2015-2017 has been 1.5-1.9 -Continue to monitor urine output -doubt would be candidate for HD  HTN -IV PRN as NPO  Hypothyroidism -Continue synthroid  Anxiety -Continue zoloft  Memory loss -Progressive memory loss at home, although quite functional and independent. No formal dx of dementia although family notes mentation decline over the past several months  -B12 low-- replace IM   DVT prophylaxis: SCD  Code Status: DNR Family Communication: Daughter/HCPOA at bedside Disposition Plan:    Consultants:   Orthopedic surgery    Antimicrobials:  Anti-infectives (From admission, onward)   Start     Dose/Rate Route Frequency Ordered Stop   05/25/17 0600  ceFAZolin (ANCEF) IVPB 2g/100 mL premix  Status:  Discontinued     2 g 200 mL/hr over 30 Minutes Intravenous On call to O.R. 05/24/17 2044 05/25/17 1319       Subjective: C/o spasms  Objective: Vitals:   05/26/17 1340 05/26/17 1345 05/26/17 1350 05/26/17 1355  BP:  (!) 200/56 (!) 202/58 (!) 207/55  Pulse: (!) 104 (!) 104 (!) 108 (!) 105  Resp: (!) 21 20 20  (!) 24  Temp:      TempSrc:      SpO2: 95% 92% 93% 94%  Weight:      Height:        Intake/Output Summary (Last 24 hours) at 05/26/2017 1431 Last data filed at 05/26/2017 0700 Gross per 24 hour  Intake 482 ml  Output 1200 ml  Net -718 ml   Filed Weights   05/23/17 2145  Weight: 52.2 kg (115 lb)    Examination:  General exam: frail female,  in pain Respiratory system: unable to turn to listen posterior, no wheezing anterior Cardiovascular system: rrr, + murmur Gastrointestinal system: +Bs, soft Central nervous system: awake, alert, interactive Extremities: no LE edema Skin: no rashes   Data Reviewed: I have personally reviewed following labs and imaging studies  CBC: Recent Labs  Lab 05/23/17 2140 05/24/17 0545 05/25/17 0436 05/26/17 0235    WBC 14.4* 14.0* 10.7* 15.5*  NEUTROABS 11.6*  --   --   --   HGB 9.9* 8.9* 7.2* 10.3*  HCT 30.6* 27.0* 22.6* 31.8*  MCV 96.2 96.4 98.7 93.5  PLT 224 199 164 198   Basic Metabolic Panel: Recent Labs  Lab 05/23/17 2140 05/24/17 0544 05/25/17 0436 05/26/17 0235  NA 138 138 140 141  K 4.1 4.3 4.0 4.0  CL 102 104 109 108  CO2 25 26 22 22   GLUCOSE 129* 122* 98 128*  BUN 23* 25* 28* 29*  CREATININE 1.58* 1.48* 1.77* 1.88*  CALCIUM 8.4* 8.0* 7.2* 8.0*   GFR: Estimated Creatinine Clearance: 13.8 mL/min (A) (by C-G formula based on SCr of 1.88 mg/dL (H)). Liver Function Tests: Recent Labs  Lab 05/23/17 2140  AST 19  ALT 18  ALKPHOS 52  BILITOT 0.6  PROT 6.3*  ALBUMIN 3.3*   No results for input(s): LIPASE, AMYLASE in the last 168 hours. No results for input(s): AMMONIA in the last 168 hours. Coagulation Profile: No results for input(s): INR, PROTIME in the last 168 hours. Cardiac Enzymes: Recent Labs  Lab 05/23/17 2140 05/24/17 0300 05/24/17 0930  TROPONINI 0.05* 0.05* 0.04*   BNP (last 3 results) No results for input(s): PROBNP in the last 8760 hours. HbA1C: No results for input(s): HGBA1C in the last 72 hours. CBG: Recent Labs  Lab 05/25/17 1518  GLUCAP 117*   Lipid Profile: No results for input(s): CHOL, HDL, LDLCALC, TRIG, CHOLHDL, LDLDIRECT in the last 72 hours. Thyroid Function Tests: Recent Labs    05/26/17 0235  TSH 4.152   Anemia Panel: Recent Labs    05/26/17 0235  VITAMINB12 195   Sepsis Labs: No results for input(s): PROCALCITON, LATICACIDVEN in the last 168 hours.  Recent Results (from the past 240 hour(s))  Surgical pcr screen     Status: None   Collection Time: 05/24/17 10:26 PM  Result Value Ref Range Status   MRSA, PCR NEGATIVE NEGATIVE Final   Staphylococcus aureus NEGATIVE NEGATIVE Final    Comment: (NOTE) The Xpert SA Assay (FDA approved for NASAL specimens in patients 8 years of age and older), is one component of a  comprehensive surveillance program. It is not intended to diagnose infection nor to guide or monitor treatment. Performed at [facility], [address], [place], [place] [address]        Radiology Studies: Dg Shoulder 1v Left  Result Date: 05/24/2017 CLINICAL DATA:  Known proximal left humerus fracture EXAM: LEFT SHOULDER - 1 VIEW COMPARISON:  05/23/2017 humerus and CXR radiographs FINDINGS: Acute left surgical neck fracture of the humerus with 6 mm of medial displacement of the humeral head is identified on current exam. The 45 degrees or so of angulation measured on prior study is less apparent on this single view. The acute displaced left sixth rib fracture seen on prior exams is obscured due to overlap on current study. IMPRESSION: 1. 6 mm of medial displacement of known acute surgical neck fracture of the left humerus is seen on this single frontal view of the left shoulder. The 45 degrees  of angulation is not apparent on this study. 2. The acute minimally displaced left sixth rib fracture is also obscured on current exam. Electronically Signed   By: Tollie Ethavid  Kwon M.D.   On: 05/24/2017 18:34      Scheduled Meds: . [MAR Hold] cyanocobalamin  1,000 mcg Intramuscular Once  . [MAR Hold] diclofenac sodium  2 g Topical QID  . [MAR Hold] feeding supplement (ENSURE ENLIVE)  237 mL Oral BID BM  . [MAR Hold] levothyroxine  25 mcg Intravenous Daily  . [MAR Hold] mometasone-formoterol  2 puff Inhalation BID  . [MAR Hold] sertraline  25 mg Oral Daily   Continuous Infusions: . dextrose 50 mL/hr at 05/26/17 1200  . lactated ringers 10 mL/hr at 05/26/17 1336  . [MAR Hold] methocarbamol (ROBAXIN)  IV       LOS: 2 days    Time spent: 35 minutes   Joseph ArtJessica U Sarita Hakanson, DO [facility] [email or web address] Password [email or web address] 05/26/2017, 2:31 PM

## 2017-05-26 NOTE — Progress Notes (Signed)
The patient has been re-examined, and the chart reviewed, and there have been no interval changes to the documented history and physical.    The risks benefits and alternatives were discussed with the patient including but not limited to the risks of nonoperative treatment, versus surgical intervention including infection, bleeding, nerve injury, malunion, nonunion, the need for revision surgery, hardware prominence, hardware failure, the need for hardware removal, blood clots, cardiopulmonary complications, morbidity, mortality, among others, and they were willing to proceed.

## 2017-05-26 NOTE — Transfer of Care (Signed)
Immediate Anesthesia Transfer of Care Note  Patient: Meagan Osborn  Procedure(s) Performed: OPEN REDUCTION INTERNAL FIXATION (ORIF) PROXIMAL HUMERUS FRACTURE (Left )  Patient Location: PACU  Anesthesia Type:GA combined with regional for post-op pain  Level of Consciousness: drowsy, lethargic and responds to stimulation  Airway & Oxygen Therapy: Patient Spontanous Breathing and Patient connected to nasal cannula oxygen  Post-op Assessment: Report given to RN and Post -op Vital signs reviewed and stable  Post vital signs: Reviewed and stable  Last Vitals:  Vitals Value Taken Time  BP 167/62 05/26/2017  6:44 PM  Temp    Pulse 93 05/26/2017  6:49 PM  Resp 21 05/26/2017  6:49 PM  SpO2 88 % 05/26/2017  6:49 PM  Vitals shown include unvalidated device data.  Last Pain:  Vitals:   05/26/17 0921  TempSrc: Oral  PainSc:       Patients Stated Pain Goal: 0 (05/25/17 1315)  Complications: No apparent anesthesia complications

## 2017-05-26 NOTE — Anesthesia Postprocedure Evaluation (Signed)
Anesthesia Post Note  Patient: Meagan Osborn  Procedure(s) Performed: OPEN REDUCTION INTERNAL FIXATION (ORIF) PROXIMAL HUMERUS FRACTURE (Left )     Patient location during evaluation: PACU Anesthesia Type: General and Regional Level of consciousness: responds to stimulation Pain management: pain level controlled Vital Signs Assessment: post-procedure vital signs reviewed and stable Respiratory status: spontaneous breathing, nonlabored ventilation, respiratory function stable and patient connected to nasal cannula oxygen Cardiovascular status: blood pressure returned to baseline and stable Postop Assessment: no apparent nausea or vomiting Anesthetic complications: no    Last Vitals:  Vitals:   05/26/17 2036 05/26/17 2246  BP: (!) 202/87 (!) 152/71  Pulse: (!) 102 91  Resp: 20   Temp: 36.4 C   SpO2:      Last Pain:  Vitals:   05/26/17 2036  TempSrc: Oral  PainSc:                  Ryan P Ellender

## 2017-05-26 NOTE — Care Management Note (Addendum)
Case Management Note  Patient Details  Name: Meagan Osborn MRN: [id number] Date of Birth: [birth year]  Subjective/Objective:   History ofasthma, CKDstage4,progressive memory loss; admitted for Closed fracture of left proximal humerus s/p fall.  PCP noted.             Action/Plan: Ortho consulted. Prior to admission patient lived at home with daughter [name] who is HCPOA and son-in-law.  NCM will continue to follow for any discharge transition needs.  Expected Discharge Date:    To Be Determined.              Expected Discharge Plan:  Home/Self Care  In-House Referral:     Discharge planning Services  CM Consult  Post Acute Care Choice:    Choice offered to:     DME Arranged:    DME Agency:     HH Arranged:    HH Agency:     Status of Service:  In process, will continue to follow  If discussed at Long Length of Stay Meetings, dates discussed:    Additional Comments:  Yancey FlemingsKimberly R Becton, RN  Nurse case manager [place] 05/26/2017, 11:04 AM

## 2017-05-26 NOTE — Anesthesia Preprocedure Evaluation (Addendum)
Anesthesia Evaluation  Patient identified by MRN, date of birth, ID band Patient awake    Reviewed: Allergy & Precautions, NPO status , Patient's Chart, lab work & pertinent test results  Airway Mallampati: II  TM Distance: >3 FB Neck ROM: Full    Dental no notable dental hx.    Pulmonary COPD,    Pulmonary exam normal breath sounds clear to auscultation       Cardiovascular Exercise Tolerance: Good hypertension, Pt. on medications Normal cardiovascular exam Rhythm:Regular Rate:Normal     Neuro/Psych negative neurological ROS  negative psych ROS   GI/Hepatic negative GI ROS, Neg liver ROS,   Endo/Other    Renal/GU Renal diseasenegative Renal ROS     Musculoskeletal negative musculoskeletal ROS (+)   Abdominal   Peds  Hematology  (+) anemia ,   Anesthesia Other Findings   Reproductive/Obstetrics                             Lab Results  Component Value Date   CREATININE 1.88 (H) 05/26/2017   BUN 29 (H) 05/26/2017   NA 141 05/26/2017   K 4.0 05/26/2017   CL 108 05/26/2017   CO2 22 05/26/2017    Lab Results  Component Value Date   WBC 15.5 (H) 05/26/2017   HGB 10.3 (L) 05/26/2017   HCT 31.8 (L) 05/26/2017   MCV 93.5 05/26/2017   PLT 198 05/26/2017    Anesthesia Physical Anesthesia Plan  ASA: IV  Anesthesia Plan: Regional and General   Post-op Pain Management:  Regional for Post-op pain   Induction: Intravenous  PONV Risk Score and Plan: 2 and Treatment may vary due to age or medical condition  Airway Management Planned: Oral ETT  Additional Equipment:   Intra-op Plan:   Post-operative Plan: Possible Post-op intubation/ventilation  Informed Consent: I have reviewed the patients History and Physical, chart, labs and discussed the procedure including the risks, benefits and alternatives for the proposed anesthesia with the patient or authorized representative who  has indicated his/her understanding and acceptance.   Dental advisory given  Plan Discussed with: CRNA  Anesthesia Plan Comments:        Anesthesia Quick Evaluation

## 2017-05-26 NOTE — Anesthesia Procedure Notes (Signed)
Procedure Name: Intubation Date/Time: 05/26/2017 4:13 PM Performed by: Lovie Cholock, Cartier Washko K, CRNA Pre-anesthesia Checklist: Patient identified, Emergency Drugs available, Suction available and Patient being monitored Patient Re-evaluated:Patient Re-evaluated prior to induction Oxygen Delivery Method: Circle System Utilized Preoxygenation: Pre-oxygenation with 100% oxygen Induction Type: IV induction Ventilation: Mask ventilation without difficulty Laryngoscope Size: Miller and 2 Grade View: Grade I Tube type: Oral Tube size: 7.0 mm Number of attempts: 1 Airway Equipment and Method: Stylet and Oral airway Placement Confirmation: ETT inserted through vocal cords under direct vision,  positive ETCO2 and breath sounds checked- equal and bilateral Secured at: 21 cm Tube secured with: Tape Dental Injury: Teeth and Oropharynx as per pre-operative assessment

## 2017-05-26 NOTE — Significant Event (Addendum)
Rapid Response Event Note  Overview: Suspected Blood Transfusion reaction  Initial Focused Assessment: Called by staff nurse about patient having BP of 249/226 after blood transfusion was started. RN informed me that blood was stopped and primary service was notified. I asked the staff nurse to have the primary RN recheck manual BPs on the patient and make sure that the cuff was positioned correctly and sized appropriately. When I arrived, RNs obtained a new automatic cuff BP and manual BP and both correlated SBP in the 170s (per chart and per patient's daughter, SBP in the 150-170 is where the BP have been). HR in the low 100s, sats > 95% on 2L, RR 16-20.  Patient was not in acute respiratory, no hives, no redness, skin warm and dry, + pulses, lungs were clear and good air movement bilaterally. Patient was however restless, uncomfortable, and writhing in pain. She was grimacing and stated she was very uncomfortable (L humerus fx, L rib fx, pubic rami fx), pending ORIF of humerus. Patient responds to questions at times, periods noted of what appears to be dementia like behavior.   Interventions: -- Engineer, drillingtaff RN gave ordered Morphine and Zofran -- Reaction labs and morning labs were drawn -- Patient was repositioned  -- Emotional support given to patient's daughter as well   Plan of Care: -- Continue supportive care -- Monitor VS. I placed the patient on continuous pulse oximetry with continuous telemetry as well for monitoring. -- Should the patient require blood transfusions, RNs and staff need to make sure that patient is pre-treated (if ordered) and MDs are aware.  Event Summary:   [facility] Arrival Time 138 End Time 345  Meagan Osborn

## 2017-05-26 NOTE — OR Nursing (Signed)
Updated patient's family via family waiting room front desk personnel at 17:30

## 2017-05-26 NOTE — Progress Notes (Signed)
[facility]  Patient Status: Active (receiving services up to time of hospitalization)  [facility] is providing the following services: PT  If patient discharges after hours, please call [phone].   Kizzie FurnishDonna Fellmy 05/26/2017, 1:50 PM

## 2017-05-26 NOTE — Discharge Instructions (Signed)
Diet: As you were doing prior to hospitalization   Shower:  May shower but keep the wounds dry, use an occlusive plastic wrap, NO SOAKING IN TUB.  If the bandage gets wet, change with a clean dry gauze.  If you have a splint on, leave the splint in place and keep the splint dry with a plastic bag.  Dressing:  You may change your dressing 3-5 days after surgery, unless you have a splint.  If you have a splint, then just leave the splint in place and we will change your bandages during your first follow-up appointment.    If you had hand or foot surgery, we will plan to remove your stitches in about 2 weeks in the office.  For all other surgeries, there are sticky tapes (steri-strips) on your wounds and all the stitches are absorbable.  Leave the steri-strips in place when changing your dressings, they will peel off with time, usually 2-3 weeks.  Activity:  Increase activity slowly as tolerated, but follow the weight bearing instructions below.  The rules on driving is that you can not be taking narcotics while you drive, and you must feel in control of the vehicle.    Weight Bearing:   Sling at all times left upper extremity except hygiene.  Ok to move fingers wrist and elbow.  WBAT both lower extremities, NWB left upper extremity.    To prevent constipation: you may use a stool softener such as -  Colace (over the counter) 100 mg by mouth twice a day  Drink plenty of fluids (prune juice may be helpful) and high fiber foods Miralax (over the counter) for constipation as needed.    Itching:  If you experience itching with your medications, try taking only a single pain pill, or even half a pain pill at a time.  You may take up to 10 pain pills per day, and you can also use benadryl over the counter for itching or also to help with sleep.   Precautions:  If you experience chest pain or shortness of breath - call 911 immediately for transfer to the hospital emergency department!!  If you develop a  fever greater that 101 F, purulent drainage from wound, increased redness or drainage from wound, or calf pain -- Call the office at [phone]                                                Follow- Up Appointment:  Please call for an appointment to be seen in [phone]

## 2017-05-26 NOTE — Op Note (Signed)
05/23/2017 - 05/26/2017  5:57 PM  PATIENT:  Meagan Osborn    PRE-OPERATIVE DIAGNOSIS:  LEFT PROXIMAL HUMERUS FRACTURE  POST-OPERATIVE DIAGNOSIS:  Same  PROCEDURE:  OPEN REDUCTION INTERNAL FIXATION (ORIF) PROXIMAL HUMERUS FRACTURE  SURGEON:  Eulas PostJoshua P Anzley Dibbern, MD  PHYSICIAN ASSISTANT: Janace LittenBrandon Parry, OPA-C, present and scrubbed throughout the case, critical for completion in a timely fashion, and for retraction, instrumentation, and closure.  ANESTHESIA:   General with regional   ESTIMATED BLOOD LOSS: 100 ml  PREOPERATIVE INDICATIONS:  Meagan Osborn is a  84 y.o. female with a diagnosis of LEFT PROXIMAL HUMERUS FRACTURE who elected for surgical management.  I had a very long discussion with the family over multiple days with regarding the risks benefits and alternatives.  She has significant medical comorbidities, but given the fact that she needs her upper extremity in order to function with her multiple other fractures, she elected for surgical management.  The risks benefits and alternatives were discussed with the patient including but not limited to the risks of nonoperative treatment, versus surgical intervention including infection, bleeding, nerve injury, malunion, nonunion, the need for revision surgery, hardware prominence, hardware failure, the need for hardware removal, blood clots, cardiopulmonary complications, conversion to arthroplasty, morbidity, mortality, among others, and they were willing to proceed.  Predicted outcome is good, although there will be at least a six to nine month expected recovery.   OPERATIVE IMPLANTS: Biomet ALPS proximal humerus locking plate.  I used 2 distal cortical locking screws and 2 nonlocking screws in order to secure the fracture, in order to minimize risk for loss of fixation because she would likely be using that arm for some degree of weightbearing.    OPERATIVE FINDINGS: Displaced proximal humerus fracture.  UNIQUE ASPECTS OF THE CASE: Bone  quality within the head was actually not that bad.  OPERATIVE PROCEDURE: The patient was brought to the operating room and placed in the supine position. General anesthesia was administered. IV antibiotics were given. She was placed in the beach chair position. All bony prominences were padded. The upper extremity was prepped and draped in usual sterile fashion. Deltopectoral incision was performed.  I exposed the fracture site, and placed deep retractors. I did not tenotomize the biceps tendon. This was left in place. I elevated a small portion of the deltoid off of the shaft, in order to gain access for the plate. I placed supraspinatus and subscapularis stitches, and then reduced the head onto the shaft. This was maintained in satisfactory position.  I applied the plate and secured it into the sliding hole first. I confirmed position of the reduction and the plate with C-arm, and I placed a total of 2 guidewires into the appropriate position in the head. I was satisfied that the plate was distal appropriately, and then secured the plate proximally with smooth pegs, taking care to prevent penetration into the arch articular surface, using C-arm, as well as manual feel using a hand drill.  I then secured the plate distally using another cortical screw and two locking screws. I then passed the FiberWire sutures from the subscapularis and supraspinatus through the plate and secured the tuberosities. Once complete fixation and reduction of been achieved, took final C-arm pictures, and irrigated the wounds copiously, and repaired the deltopectoral interval with Vicryl followed by Vicryl for the subcutaneous tissue with Monocryl and Steri-Strips for the skin. She was placed in a sling. She had a preoperative regional block as well. She tolerated the procedure well with  no complications.

## 2017-05-26 NOTE — Evaluation (Signed)
Clinical/Bedside Swallow Evaluation Patient Details  Name: Meagan Osborn MRN: [id number] Date of Birth: 1920-03-12  Today's Date: 05/26/2017 Time: SLP Start Time (ACUTE ONLY): 0934 SLP Stop Time (ACUTE ONLY): 0953 SLP Time Calculation (min) (ACUTE ONLY): 19 min  Past Medical History:  Past Medical History:  Diagnosis Date  . Anxiety   . Arthritis   . Asthma   . Closed fracture of left proximal humerus 05/23/2017  . Hypertension   . Hypothyroidism   . Left rib fracture 05/23/2017  . Macular degeneration   . Osteoporosis   . Pubic ramus fracture, left, closed, initial encounter (HCC) 05/23/2017  . Thyroid disease    Past Surgical History:  Past Surgical History:  Procedure Laterality Date  . APPENDECTOMY    . EYE SURGERY     HPI:  Meagan Osborn is a 84 y.o. female with medical history significant for asthma, CKD4, who presented to the ED after patient sustained a fall at home. Suffered Left proximal humerus fracture, left 6th rib fracture, pubic rami fracture. Per chart patient lives with her daughter. Recent congestion and some cough and poor p.o., oral pocketing and holding. Per MD note daughter reports some short term memory problems, worse over the past few months but has not been formally diagnosed with dementia. MBS 02/2017 oral dysphagia, holding, piecemealing, prominent cricopharyngeus and reg/thin recommended.    Assessment / Plan / Recommendation Clinical Impression  Pt scheduled for surgery today therefore, bedside assessment limited to two sips water and obtaining prior/current swallow status and providing education with daughter who reports progressive decline in swallow since Nov 2018. She states pt holds boluses and performs multiple swallows with thin liquid to clear oral cavity and "thicker seems to be better." Suspect pt's control and cohesion and weight ot bolus easier to manipulate with thicker. Daughter suspects her mom may have dementia and asking questions re:  future swallowing and mentioned feeding tube. SLP provided education in regards to progressive cognitive impairments and oropharyngeal swallow ability. Daughter may be open to Palliative care consult. ST will follow up tomorrow.   SLP Visit Diagnosis: Dysphagia, oral phase (R13.11)    Aspiration Risk  Moderate aspiration risk    Diet Recommendation NPO(NPO for surgery)   Liquid Administration via: Cup;Straw Medication Administration: Crushed with puree Supervision: Patient able to self feed;Full supervision/cueing for compensatory strategies Compensations: Minimize environmental distractions Postural Changes: Seated upright at 90 degrees    Other  Recommendations Oral Care Recommendations: Oral care BID   Follow up Recommendations Other (comment)(TBD)      Frequency and Duration min 2x/week  2 weeks       Prognosis Prognosis for Safe Diet Advancement: Fair Barriers to Reach Goals: Cognitive deficits      Swallow Study   General HPI: Meagan Osborn is a 84 y.o. female with medical history significant for asthma, CKD4, who presented to the ED after patient sustained a fall at home. Suffered Left proximal humerus fracture, left 6th rib fracture, pubic rami fracture. Per chart patient lives with her daughter. Recent congestion and some cough and poor p.o., oral pocketing and holding. Per MD note daughter reports some short term memory problems, worse over the past few months but has not been formally diagnosed with dementia. MBS 02/2017 oral dysphagia, holding, piecemealing, prominent cricopharyngeus and reg/thin recommended.  Type of Study: Bedside Swallow Evaluation Previous Swallow Assessment: (see HPI) Diet Prior to this Study: NPO Temperature Spikes Noted: No Respiratory Status: Nasal cannula History of Recent Intubation: No  Behavior/Cognition: Alert;Cooperative;Pleasant mood;Distractible;Requires cueing Oral Care Completed by SLP: No Oral Cavity - Dentition: Adequate natural  dentition Vision: Functional for self-feeding Self-Feeding Abilities: Able to feed self Patient Positioning: Upright in bed Baseline Vocal Quality: Normal Volitional Cough: Weak(rib fx)    Oral/Motor/Sensory Function Overall Oral Motor/Sensory Function: Within functional limits   Ice Chips Ice chips: Not tested   Thin Liquid Thin Liquid: Impaired Presentation: Straw Oral Phase Functional Implications: Other (comment);Oral holding(appeared to be piecemeal transit) Pharyngeal  Phase Impairments: (none)    Nectar Thick Nectar Thick Liquid: Not tested   Honey Thick Honey Thick Liquid: Not tested   Puree Puree: Not tested(NPO for surgery today)   Solid   GO   Solid: Not tested(NPO for surgery today)        Meagan Osborn, Meagan Osborn 05/26/2017,10:14 AM   Meagan Osborn M.Ed [employer]SLP Pager [phone]

## 2017-05-26 NOTE — Progress Notes (Signed)
Patient had a suspected blood reaction about 10 minutes into receiving the second unit of  PRBC's. She developed nausea, and became very restless. She complained of numbness in the hands that traveled up to her jaw. She had a systolic bp of 249. The blood was stopped, saline was hung and the doctor was notified. The blood was taken back to blood bank. Rapid response was notified. She has returned back to her baseline and including her BP. Patient's nurse has been notified of the events and is at the bedside.

## 2017-05-26 NOTE — Progress Notes (Signed)
Initial Nutrition Assessment  DOCUMENTATION CODES:   Not applicable  INTERVENTION:    Monitor for diet advancement/toleration  Magic cup TID with meals, each supplement provides 290 kcal and 9 grams of protein once advanced.  NUTRITION DIAGNOSIS:   Increased nutrient needs related to acute illness(multiple fractures) as evidenced by estimated needs.  GOAL:   Patient will meet greater than or equal to 90% of their needs  MONITOR:   PO intake, Supplement acceptance, Diet advancement, Weight trends, Labs  REASON FOR ASSESSMENT:   Malnutrition Screening Tool    ASSESSMENT:   Patient with PMH significant for asthma, HTN, memory loss, and CKD IV. Presents this admission after a mechanical fall at home resulting in left rib fracture, pubic rami fracture, and left humeral fracture.    Spoke with daughter Meagan Osborn at bedside. Reports pt's appetite has slowly declined over the last two to three months due to swallowing issues. Pt typically tries to eat 2-3 meals each day but can only tolerate bites. Daughter states pt will hold food items in her mouth or forget to swallow. Pt can tolerate thicker consistencies versus thin liquids. SLP saw pt today and pt was found to have moderate aspiration risk. Pt is currently NPO for surgery. Will provide supplementation once diet is advanced.   Daughter endorses pt's UBW stays around 115 lb, the last time being at that weight three months ago. Records are limited in weight history but show pt weighing between 112-115 lb since 2017. A limited Nutrition-Focused physical exam completed.   Medications reviewed and include: Vit B12, D5 @ 50 ml/hr, IV abx Labs reviewed.   NUTRITION - FOCUSED PHYSICAL EXAM:    Most Recent Value  Orbital Region  No depletion  Upper Arm Region  Unable to assess  Thoracic and Lumbar Region  Unable to assess  Buccal Region  No depletion  Temple Region  Mild depletion  Clavicle Bone Region  Mild depletion  Clavicle and  Acromion Bone Region  Unable to assess  Scapular Bone Region  Unable to assess  Dorsal Hand  Mild depletion  Patellar Region  Moderate depletion  Anterior Thigh Region  Moderate depletion  Posterior Calf Region  Moderate depletion  Edema (RD Assessment)  Mild  Hair  Reviewed  Eyes  Reviewed  Mouth  Unable to assess  Skin  Reviewed  Nails  Reviewed     Diet Order:  Diet NPO time specified  EDUCATION NEEDS:   Not appropriate for education at this time  Skin:  Skin Assessment: Reviewed RN Assessment  Last BM:  05/24/17  Height:   Ht Readings from Last 1 Encounters:  05/23/17 5\' 1"  (1.549 m)    Weight:   Wt Readings from Last 1 Encounters:  05/23/17 115 lb (52.2 kg)    Ideal Body Weight:  47.7 kg  BMI:  Body mass index is 21.73 kg/m.  Estimated Nutritional Needs:   Kcal:  1400-1600 kcal  Protein:  70-80 g  Fluid:  >1.4 L/day    Meagan Osborn RD, LDN Clinical Nutrition Pager # [phone]- [phone]

## 2017-05-27 ENCOUNTER — Encounter (HOSPITAL_COMMUNITY): Payer: Self-pay | Admitting: Orthopedic Surgery

## 2017-05-27 DIAGNOSIS — Z7189 Other specified counseling: Secondary | ICD-10-CM

## 2017-05-27 DIAGNOSIS — R1310 Dysphagia, unspecified: Secondary | ICD-10-CM

## 2017-05-27 DIAGNOSIS — Z515 Encounter for palliative care: Secondary | ICD-10-CM

## 2017-05-27 LAB — TYPE AND SCREEN
ABO/RH(D): O POS
ANTIBODY SCREEN: NEGATIVE
UNIT DIVISION: 0
Unit division: 0

## 2017-05-27 LAB — CBC
HEMATOCRIT: 30.6 % — AB (ref 36.0–46.0)
HEMOGLOBIN: 9.7 g/dL — AB (ref 12.0–15.0)
MCH: 29.5 pg (ref 26.0–34.0)
MCHC: 31.7 g/dL (ref 30.0–36.0)
MCV: 93 fL (ref 78.0–100.0)
Platelets: 212 10*3/uL (ref 150–400)
RBC: 3.29 MIL/uL — ABNORMAL LOW (ref 3.87–5.11)
RDW: 16.1 % — AB (ref 11.5–15.5)
WBC: 15.9 10*3/uL — ABNORMAL HIGH (ref 4.0–10.5)

## 2017-05-27 LAB — BASIC METABOLIC PANEL
Anion gap: 11 (ref 5–15)
BUN: 40 mg/dL — AB (ref 6–20)
CALCIUM: 7.9 mg/dL — AB (ref 8.9–10.3)
CO2: 22 mmol/L (ref 22–32)
CREATININE: 1.94 mg/dL — AB (ref 0.44–1.00)
Chloride: 110 mmol/L (ref 101–111)
GFR calc non Af Amer: 21 mL/min — ABNORMAL LOW (ref >60)
GFR, EST AFRICAN AMERICAN: 24 mL/min — AB (ref >60)
Glucose, Bld: 111 mg/dL — ABNORMAL HIGH (ref 65–99)
Potassium: 4.1 mmol/L (ref 3.5–5.1)
SODIUM: 143 mmol/L (ref 135–145)

## 2017-05-27 LAB — BPAM RBC
Blood Product Expiration Date: 201905082359
Blood Product Expiration Date: 201905082359
ISSUE DATE / TIME: 201904141819
ISSUE DATE / TIME: 201904150040
Unit Type and Rh: 5100
Unit Type and Rh: 5100

## 2017-05-27 MED ORDER — ACETAMINOPHEN 650 MG RE SUPP
650.0000 mg | Freq: Four times a day (QID) | RECTAL | Status: DC
  Administered 2017-05-27 – 2017-05-28 (×5): 650 mg via RECTAL
  Filled 2017-05-27 (×4): qty 1

## 2017-05-27 MED ORDER — MORPHINE SULFATE (PF) 2 MG/ML IV SOLN
1.0000 mg | INTRAVENOUS | Status: DC | PRN
  Filled 2017-05-27: qty 1

## 2017-05-27 MED ORDER — MORPHINE SULFATE (PF) 2 MG/ML IV SOLN: 0.5000 mg | INTRAVENOUS | Status: DC | PRN

## 2017-05-27 MED ORDER — METOPROLOL TARTRATE 5 MG/5ML IV SOLN
5.0000 mg | Freq: Four times a day (QID) | INTRAVENOUS | Status: DC | PRN
  Administered 2017-05-27: 5 mg via INTRAVENOUS
  Filled 2017-05-27: qty 5

## 2017-05-27 NOTE — Evaluation (Signed)
Physical Therapy Evaluation Patient Details Name: Meagan Osborn MRN: [id number] DOB: 1920/09/07 Today's Date: 05/27/2017   History of Present Illness  Pt is a 84 y.o. female admitted 05/23/17 post-unwitnessed fall at home without LOC, sustaining L humerus fx, L pubic ramus fx and L 6th rib fx. Head CT shows L parietal scalp contusion without underlying skull fx; chronic appearing small vessel ischemia. Now s/p L humerus ORIF 4/15. PMH includes HTN, memory loss, CKD IV, asthma, anxiety, osteoporosis.     Clinical Impression  Pt presents with an overall decrease in functional mobility secondary to above. PTA, pt mostly indep with mobility and ADLs; lives with family available for 24/7 support. Daughter reports this was the pt's first fall. Today, pt required modA+2 to stand and attempt gait training, eventually requiring maxA+2 to stand pivot to recliner due to L knee buckling and fatigue. Pt limited by c/o lightheadedness and nausea; seated BP 150s/105. Pt would benefit from continued acute PT services to maximize functional mobility and independence prior to d/c with SNF-level therapies.     Follow Up Recommendations SNF;Supervision/Assistance - 24 hour    Equipment Recommendations  (TBD next venue)    Recommendations for Other Services       Precautions / Restrictions Precautions Precautions: Fall;Shoulder;Other (comment) Type of Shoulder Precautions: L rib fx Shoulder Interventions: Shoulder sling/immobilizer;At all times Precaution Comments: No AROM/PROM L shoulder Required Braces or Orthoses: Sling Restrictions Weight Bearing Restrictions: Yes LUE Weight Bearing: Non weight bearing LLE Weight Bearing: Weight bearing as tolerated      Mobility  Bed Mobility Overal bed mobility: Needs Assistance Bed Mobility: Supine to Sit     Supine to sit: Max assist     General bed mobility comments: Pt able to initiate bringing BLEs to EOB and attempting to push with RUE, requiring  maxA for trunk elevation and to scoot hips to EOB  Transfers Overall transfer level: Needs assistance Equipment used: 1 person hand held assist Transfers: Sit to/from [employer] CorporationStand;Stand Pivot Transfers Sit to Stand: Mod assist;+2 physical assistance;From elevated surface Stand pivot transfers: Max assist;+2 physical assistance       General transfer comment: ModA+2 to assist trunk elevation and cue fully upright posture; L knee instability requiring assist to prevent buckling. Pt able to take few shuffling steps towards chair with L knee buckling, requiring maxA+2 to eventually stand pivot due to fatigue  Ambulation/Gait                Stairs            Wheelchair Mobility    Modified Rankin (Stroke Patients Only)       Balance Overall balance assessment: Needs assistance Sitting-balance support: Single extremity supported;Feet supported Sitting balance-Leahy Scale: Fair       Standing balance-Leahy Scale: Poor Standing balance comment: Requires HHA and external assist                             Pertinent Vitals/Pain Pain Assessment: Faces Faces Pain Scale: Hurts even more Pain Location: L shoulder Pain Descriptors / Indicators: Sore;Guarding;Grimacing Pain Intervention(s): Monitored during session;Limited activity within patient's tolerance;Repositioned    Home Living Family/patient expects to be discharged to:: Private residence Living Arrangements: Children Available Help at Discharge: Family;Available 24 hours/day Type of Home: House Home Access: Stairs to enter   Enter[employer] CorporationEntrance Stairs-Number of Steps: 5-6 Home Layout: One level Home Equipment: Cane - single point;Wheelchair - manual Additional Comments: Lives with daughter  and son-in-law who are available for 24/7 support    Prior Function Level of Independence: Independent;Needs assistance   Gait / Transfers Assistance Needed: Daughter reports mostly indep with amb and ADLs; no DME. Family  noticed increased "weakness" over winter with pt less active, so pt had been working with HHPT           Hand Dominance        Extremity/Trunk Assessment   Upper Extremity Assessment Upper Extremity Assessment: LUE deficits/detail;Defer to OT evaluation LUE Deficits / Details: s/p L humerus ORIF    Lower Extremity Assessment Lower Extremity Assessment: Overall WFL for tasks assessed;LLE deficits/detail;RLE deficits/detail RLE Deficits / Details: Strength grossly 4/5 throughout LLE Deficits / Details: L pubic ramus fx; strength grossly 3/5 throughout; L knee buckling in WB    Cervical / Trunk Assessment Cervical / Trunk Assessment: Kyphotic  Communication   Communication: HOH  Cognition Arousal/Alertness: Awake/alert Behavior During Therapy: WFL for tasks assessed/performed Overall Cognitive Status: History of cognitive impairments - at baseline Area of Impairment: Attention;Memory;Following commands;Safety/judgement;Awareness;Problem solving                   Current Attention Level: Sustained Memory: Decreased short-term memory;Decreased recall of precautions Following Commands: Follows one step commands with increased time;Follows multi-step commands inconsistently Safety/Judgement: Decreased awareness of safety;Decreased awareness of deficits Awareness: Emergent Problem Solving: Slow processing;Requires verbal cues        General Comments General comments (skin integrity, edema, etc.): Two daughters present throughout session    Exercises     Assessment/Plan    PT Assessment Patient needs continued PT services  PT Problem List Decreased strength;Decreased activity tolerance;Decreased range of motion;Decreased balance;Decreased mobility;Decreased knowledge of use of DME;Decreased cognition;Decreased knowledge of precautions       PT Treatment Interventions DME instruction;Gait training;Stair training;Functional mobility training;Therapeutic  activities;Therapeutic exercise;Balance training;Patient/family education    PT Goals (Current goals can be found in the Care Plan section)  Acute Rehab PT Goals Patient Stated Goal: Get stronger PT Goal Formulation: With patient/family Time For Goal Achievement: 06/10/17 Potential to Achieve Goals: Good    Frequency Min 3X/week   Barriers to discharge        Co-evaluation PT/OT/SLP Co-Evaluation/Treatment: Yes Reason for Co-Treatment: Necessary to address cognition/behavior during functional activity;For patient/therapist safety;To address functional/ADL transfers PT goals addressed during session: Mobility/safety with mobility;Balance         AM-PAC PT "6 Clicks" Daily Activity  Outcome Measure Difficulty turning over in bed (including adjusting bedclothes, sheets and blankets)?: Unable Difficulty moving from lying on back to sitting on the side of the bed? : Unable Difficulty sitting down on and standing up from a chair with arms (e.g., wheelchair, bedside commode, etc,.)?: Unable Help needed moving to and from a bed to chair (including a wheelchair)?: A Lot Help needed walking in hospital room?: A Lot Help needed climbing 3-5 steps with a railing? : Total 6 Click Score: 8    End of Session Equipment Utilized During Treatment: Gait belt;Oxygen Activity Tolerance: Patient tolerated treatment well;Patient limited by fatigue Patient left: in chair;with call bell/phone within reach;with chair alarm set;with family/visitor present Nurse Communication: Mobility status PT Visit Diagnosis: Other abnormalities of gait and mobility (R26.89);Muscle weakness (generalized) (M62.81)    Time: 1610-9604 PT Time Calculation (min) (ACUTE ONLY): 40 min   Charges:   PT Evaluation $PT Eval Moderate Complexity: 1 Mod     PT G Codes:       Ina Homes, PT, DPT Acute Rehab  Services  Pager: [phone]  Malachy Chamber 05/27/2017, 12:24 PM

## 2017-05-27 NOTE — Progress Notes (Signed)
PROGRESS NOTE    [name]  ZOX:[id number] DOB: 05/20/1920 DOA: 05/23/2017 PCP: Barbie BannerWilson, Fred H, MD     Brief Narrative:  Meagan Reedselia P Willisis a 84 y.o.femalewith medical history significantforasthma, CKD stage 4,progressive memory loss, who presented to the ED after patient sustained a fall at home.Patient lives with her daughter.Patient got up from chair and was walking towards the kitchen when she suddenly fell.Fall was unwitnessed. When daughter heard the fall and came to her aid, patient had not lost consciousness and no witnessed seizure activities. Daughter immediately called EMS. Patient's has chronic orthostatic dizziness,son-in-law suspects thismay her caused her fall, as patient had just gotten up from sitting position. Work up in the ED revealed multiple fractures including left pubic rami, left humerus, and left 6th rib. Orthopedic surgery has been consulted.   Assessment & Plan:   Principal Problem:   Closed fracture of left proximal humerus Active Problems:   Left rib fracture   Pubic ramus fracture, left, closed, initial encounter (HCC)   Pelvic fracture (HCC)   CKD (chronic kidney disease), stage IV (HCC)   Left proximal humerus fracture, left 6th rib fracture, pubic rami fracture s/p mechanical fall without loss of consciousness -Orthopedic surgery consulted, s/p OR 4/15 -Cardiac risk using Chales AbrahamsGupta scale is 0.51% for estimated risk probability for perioperative myocardial infarction or cardiac arrest. This was discussed with daughter/HCPOA -Will need significant rehab post-discharge -s/p Transfuse 2u pRBC with ? Reaction of elevated BP-- having to check BP on legs so not sure how accurate -echo: - Normal LV size with mild LV hypertrophy. EF 55-60%. Moderate   diastolic dysfunction. Normal RV size and systolic function.   Aortic valve sclerosis without significant stenosis. Calcified   mitral valve and annulus. There was mild mitral regurgitation and  probably mild mitral stenosis. Moderate pulmonary hypertension. -plan for scheduled tylenol suppository for now until able to take PO -PRN IV pain meds  CKD stage 4 -Baseline Cr in 2015-2017 has been 1.5-1.9 -Continue to monitor urine output -doubt would be candidate for HD  HTN -IV PRN as NPO  Hypothyroidism -Continue synthroid  Anxiety -Continue zoloft  Memory loss -Progressive memory loss at home, although quite functional and independent. No formal dx of dementia although family notes mentation decline over the past several months  -B12 low-- replace IM  Dysphagia -appears to be more related to memory issues-- pocketing food -discussed with daughter a palliative care referral for planning-- would not want feeding tube -SLP to see again   DVT prophylaxis: SCD  Code Status: DNR Family Communication: Daughter/HCPOA at bedside Disposition Plan:    Consultants:   Orthopedic surgery    Antimicrobials:  Anti-infectives (From admission, onward)   Start     Dose/Rate Route Frequency Ordered Stop   05/26/17 2230  ceFAZolin (ANCEF) IVPB 1 g/50 mL premix     1 g 100 mL/hr over 30 Minutes Intravenous Every 6 hours 05/26/17 2036 05/27/17 1629   05/26/17 1527  ceFAZolin (ANCEF) 2-4 GM/100ML-% IVPB    Note to Pharmacy:  Sabino NiemannHogue, Samantha   : cabinet override      05/26/17 1527 05/27/17 0344   05/25/17 0600  ceFAZolin (ANCEF) IVPB 2g/100 mL premix  Status:  Discontinued     2 g 200 mL/hr over 30 Minutes Intravenous On call to O.R. 05/24/17 2044 05/25/17 1319       Subjective: Not able to swallow  Objective: Vitals:   05/26/17 2246 05/27/17 0246 05/27/17 0455 05/27/17 0946  BP: (!) 152/71 Marland Kitchen(!)  180/76 (!) 158/69 (!) 182/71  Pulse: 91 (!) 128 (!) 130 87  Resp:   16 16  Temp:   98 F (36.7 C) 98.3 F (36.8 C)  TempSrc:   Oral Oral  SpO2:   99% 98%  Weight:      Height:        Intake/Output Summary (Last 24 hours) at 05/27/2017 1150 Last data filed at 05/27/2017  0946 Gross per 24 hour  Intake 1908.75 ml  Output 725 ml  Net 1183.75 ml   Filed Weights   05/23/17 2145  Weight: 52.2 kg (115 lb)    Examination:  General exam: in bed, frail appearing- thin female Respiratory system: unable to listen posteroir-- no wheezing Cardiovascular system: tachy this AM into the 130s Gastrointestinal system: +BS Central nervous system: will awaken and interact     Data Reviewed: I have personally reviewed following labs and imaging studies  CBC: Recent Labs  Lab 05/23/17 2140 05/24/17 0545 05/25/17 0436 05/26/17 0235 05/27/17 0608  WBC 14.4* 14.0* 10.7* 15.5* 15.9*  NEUTROABS 11.6*  --   --   --   --   HGB 9.9* 8.9* 7.2* 10.3* 9.7*  HCT 30.6* 27.0* 22.6* 31.8* 30.6*  MCV 96.2 96.4 98.7 93.5 93.0  PLT 224 199 164 198 212   Basic Metabolic Panel: Recent Labs  Lab 05/23/17 2140 05/24/17 0544 05/25/17 0436 05/26/17 0235 05/27/17 0608  NA 138 138 140 141 143  K 4.1 4.3 4.0 4.0 4.1  CL 102 104 109 108 110  CO2 25 26 22 22 22   GLUCOSE 129* 122* 98 128* 111*  BUN 23* 25* 28* 29* 40*  CREATININE 1.58* 1.48* 1.77* 1.88* 1.94*  CALCIUM 8.4* 8.0* 7.2* 8.0* 7.9*   GFR: Estimated Creatinine Clearance: 13.4 mL/min (A) (by C-G formula based on SCr of 1.94 mg/dL (H)). Liver Function Tests: Recent Labs  Lab 05/23/17 2140  AST 19  ALT 18  ALKPHOS 52  BILITOT 0.6  PROT 6.3*  ALBUMIN 3.3*   No results for input(s): LIPASE, AMYLASE in the last 168 hours. No results for input(s): AMMONIA in the last 168 hours. Coagulation Profile: No results for input(s): INR, PROTIME in the last 168 hours. Cardiac Enzymes: Recent Labs  Lab 05/23/17 2140 05/24/17 0300 05/24/17 0930  TROPONINI 0.05* 0.05* 0.04*   BNP (last 3 results) No results for input(s): PROBNP in the last 8760 hours. HbA1C: No results for input(s): HGBA1C in the last 72 hours. CBG: Recent Labs  Lab 05/25/17 1518  GLUCAP 117*   Lipid Profile: No results for input(s):  CHOL, HDL, LDLCALC, TRIG, CHOLHDL, LDLDIRECT in the last 72 hours. Thyroid Function Tests: Recent Labs    05/26/17 0235  TSH 4.152   Anemia Panel: Recent Labs    05/26/17 0235  VITAMINB12 195   Sepsis Labs: No results for input(s): PROCALCITON, LATICACIDVEN in the last 168 hours.  Recent Results (from the past 240 hour(s))  Surgical pcr screen     Status: None   Collection Time: 05/24/17 10:26 PM  Result Value Ref Range Status   MRSA, PCR NEGATIVE NEGATIVE Final   Staphylococcus aureus NEGATIVE NEGATIVE Final    Comment: (NOTE) The Xpert SA Assay (FDA approved for NASAL specimens in patients 36 years of age and older), is one component of a comprehensive surveillance program. It is not intended to diagnose infection nor to guide or monitor treatment. Performed at [facility], [address], [place], [place] [address]  Radiology Studies: Dg Humerus Left  Result Date: 05/26/2017 CLINICAL DATA:  84 year old female with left humerus fracture. Subsequent encounter. EXAM: DG C-ARM 61-120 MIN; LEFT HUMERUS - 2+ VIEW Fluoroscopic time: 55 seconds. COMPARISON:  05/24/2017. FINDINGS: Two intraoperative C-arm views submitted for review after surgery. This reveals plate and screws transfixing left humeral surgical neck fracture. IMPRESSION: Open reduction and internal fixation left humeral surgical neck fracture. Electronically Signed   By: Lacy Duverney M.D.   On: 05/26/2017 18:17   Dg C-arm 1-60 Min  Result Date: 05/26/2017 CLINICAL DATA:  84 year old female with left humerus fracture. Subsequent encounter. EXAM: DG C-ARM 61-120 MIN; LEFT HUMERUS - 2+ VIEW Fluoroscopic time: 55 seconds. COMPARISON:  05/24/2017. FINDINGS: Two intraoperative C-arm views submitted for review after surgery. This reveals plate and screws transfixing left humeral surgical neck fracture. IMPRESSION: Open reduction and internal fixation left humeral surgical neck fracture.  Electronically Signed   By: Lacy Duverney M.D.   On: 05/26/2017 18:17   Dg C-arm 1-60 Min  Result Date: 05/26/2017 CLINICAL DATA:  84 year old female with left humerus fracture. Subsequent encounter. EXAM: DG C-ARM 61-120 MIN; LEFT HUMERUS - 2+ VIEW Fluoroscopic time: 55 seconds. COMPARISON:  05/24/2017. FINDINGS: Two intraoperative C-arm views submitted for review after surgery. This reveals plate and screws transfixing left humeral surgical neck fracture. IMPRESSION: Open reduction and internal fixation left humeral surgical neck fracture. Electronically Signed   By: Lacy Duverney M.D.   On: 05/26/2017 18:17      Scheduled Meds: . acetaminophen  650 mg Rectal Q6H  . cyanocobalamin  1,000 mcg Intramuscular Once  . docusate sodium  100 mg Oral BID  . enoxaparin (LOVENOX) injection  30 mg Subcutaneous Q24H  . feeding supplement (ENSURE ENLIVE)  237 mL Oral BID BM  . levothyroxine  25 mcg Intravenous Daily  . mometasone-formoterol  2 puff Inhalation BID  . sertraline  25 mg Oral Daily   Continuous Infusions: . 0.45 % NaCl with KCl 20 mEq / L 75 mL/hr at 05/26/17 2226  .  ceFAZolin (ANCEF) IV 1 g (05/27/17 1124)     LOS: 3 days    Time spent: 35 minutes   Joseph Art, DO [facility] [email or web address] Password [email or web address] 05/27/2017, 11:50 AM

## 2017-05-27 NOTE — Consult Note (Signed)
Consultation Note Date: 05/27/2017   Patient Name: Meagan Osborn  DOB: 12-02-1920  MRN: [id number]  Age / Sex: 84 y.o., female  PCP: Christain Sacramento, MD Referring Physician: Geradine Girt, DO  Reason for Consultation: Establishing goals of care  HPI/Patient Profile: 84 y.o. female  with past medical history of HTN, hypothyroidism, anxiety, arthritis, CKD4, and macular degeneration admitted on 05/23/2017 with a fall where she hit her head, shoulder, and ribs. Prior to fall, she had been feeling bad for a few days and was on azithromycin for treatment of URI. Upon admission, she was diagnosed with multiple fractures includig left pubic rami, left humerus, and left 6th rib. Ortho was consulted and performed ORIF of left proximal humerus on 4/15.  Patient lives with her daughter and son-in-law. Per chart review, appears patient has undiagnosed dementia. Has had trouble swallowing - pocketing food and pills. Per SLP evaluation, patient is a moderate aspiration risk.   PMT consulted for [place].  Clinical Assessment and Goals of Care: I have reviewed medical records including EPIC notes, labs and imaging, assessed the patient and then met at the bedside along with patient's daughters, Meagan Osborn and Meagan Osborn,  to discuss diagnosis prognosis, GOC, EOL wishes, disposition and options.  Patient did not participate in conversation d/t dementia/fatigue. Gave permission to speak with daughters.  I introduced Palliative Medicine as specialized medical care for people living with serious illness. It focuses on providing relief from the symptoms and stress of a serious illness. The goal is to improve quality of life for both the patient and the family.  We discussed a brief life review of the patient. She was the oldest of 5 children. Was married for >60 years. Her husband passed away about 10 years ago from esophageal cancer. She has lived with her daughter, Meagan Osborn, for  the past 8 years. 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  As far as functional status, they tell me patient is still ambulatory. She can perform most ADLs, but over the past several months has begun refusing to bathe. She is also having more frequent incontinent episodes - daily. As for nutritional status, she has experienced a decline in appetite and refuses many foods. They tell me she has lost 9 pounds since last fall. They tell me of a decline in cognition- forgetting what she is doing. She wants to sleep most of the time.      We discussed her current illness and what it means in the larger context of her on-going co-morbidities.  Natural disease trajectory and expectations at EOL were discussed. Specifically, we discussed that her dementia is progressive in nature and dysphagia is a part of it. We discussed other aspects of dementia and what to expect.  I attempted to elicit values and goals of care important to the patient. They tell that control is very important to the patient. They also tell me her quality of life is important.    The difference between aggressive medical intervention and comfort care was considered in light of the patient's goals of care. We discussed that at some point it may be best to shift her focus in care from treatment to comfort.  Advanced directives, concepts specific to code status, artifical feeding and hydration, and rehospitalization were considered and discussed. We confirmed DNR status. Also discussed risks and benefits of artificial feeding - they decide they would never want a feeding tube.   We attempted to complete a MOST form but Meagan Osborn was  uncomfortable with this and prefers to make decisions as they come.   Hospice and Palliative Care services outpatient were explained and offered. We discussed what to look for when hospice care may be appropriate. Discussed palliative care follow up outpatient and they are agreeable.   Questions  and concerns were addressed.  Hard Choices booklet left for review. The family was encouraged to call with questions or concerns.   Primary Decision Maker NEXT OF KIN/HCPOA - daughter, Meagan Osborn OF RECOMMENDATIONS   - DNR confirmed - please write for palliative follow up at SNF in discharge summary - discussed progressive nature of dementia/dysphagia - decided never want a feeding tube  Code Status/Advance Care Planning:  DNR  Symptom Management:   Per primary team - denies pain currently  Palliative Prophylaxis:   Aspiration, Delirium Protocol, Frequent Pain Assessment, Oral Care and Turn Reposition  Additional Recommendations (Limitations, Scope, Preferences):  No Artificial Feeding  Psycho-social/Spiritual:   Desire for further Chaplaincy support:no  Additional Recommendations: Education on Hospice  Prognosis:   Unable to determine  Discharge Planning: [place] for rehab with Palliative care service follow-up      Primary Diagnoses: Present on Admission: . Closed fracture of left proximal humerus . Left rib fracture . Pubic ramus fracture, left, closed, initial encounter ([place]) . Pelvic fracture ([place]) . CKD (chronic kidney disease), stage IV ([place])   I have reviewed the medical record, interviewed the patient and family, and examined the patient. The following aspects are pertinent.  Past Medical History:  Diagnosis Date  . Anxiety   . Arthritis   . Asthma   . Closed fracture of left proximal humerus 05/23/2017  . Hypertension   . Hypothyroidism   . Left rib fracture 05/23/2017  . Macular degeneration   . Osteoporosis   . Pubic ramus fracture, left, closed, initial encounter ([place]) 05/23/2017  . Thyroid disease    Social History   Socioeconomic History  . Marital status: Married    Spouse name: Not on file  . Number of children: Not on file  . Years of education: Not on file  . Highest education level: Not on file    Occupational History  . Not on file  Social Needs  . Financial resource strain: Not on file  . Food insecurity:    Worry: Not on file    Inability: Not on file  . Transportation needs:    Medical: Not on file    Non-medical: Not on file  Tobacco Use  . Smoking status: Never Smoker  . Smokeless tobacco: Never Used  Substance and Sexual Activity  . Alcohol use: No  . Drug use: Not on file  . Sexual activity: Not on file  Lifestyle  . Physical activity:    Days per week: Not on file    Minutes per session: Not on file  . Stress: Not on file  Relationships  . Social connections:    Talks on phone: Not on file    Gets together: Not on file    Attends religious service: Not on file    Active member of club or organization: Not on file    Attends meetings of clubs or organizations: Not on file    Relationship status: Not on file  Other Topics Concern  . Not on file  Social History Narrative  . Not on file   History reviewed. No pertinent family history. Scheduled Meds: . acetaminophen  650 mg Rectal Q6H  .  cyanocobalamin  1,000 mcg Intramuscular Once  . docusate sodium  100 mg Oral BID  . enoxaparin (LOVENOX) injection  30 mg Subcutaneous Q24H  . feeding supplement (ENSURE ENLIVE)  237 mL Oral BID BM  . levothyroxine  25 mcg Intravenous Daily  . mometasone-formoterol  2 puff Inhalation BID  . sertraline  25 mg Oral Daily   Continuous Infusions: . 0.45 % NaCl with KCl 20 mEq / L 75 mL/hr at 05/26/17 2226  .  ceFAZolin (ANCEF) IV 1 g (05/27/17 0457)   PRN Meds:.alum & mag hydroxide-simeth, bisacodyl, hydrALAZINE, levalbuterol, magnesium citrate, menthol-cetylpyridinium **OR** phenol, metoprolol tartrate, morphine injection, ondansetron **OR** ondansetron (ZOFRAN) IV, polyethylene glycol Allergies  Allergen Reactions  . Sulfa Antibiotics Other (See Comments)    unknown   Review of Systems  Constitutional: Positive for activity change, appetite change and fatigue.        Denies pain  HENT: Positive for trouble swallowing.   Respiratory: Negative for shortness of breath.   Gastrointestinal: Negative for constipation and nausea.  Neurological: Positive for weakness.    Physical Exam  Constitutional: She is oriented to person, place, and time.  Frail, elderly female, hard of hearing  HENT:  Head: Normocephalic and atraumatic.  Cardiovascular: Normal rate and regular rhythm.  Pulmonary/Chest: Effort normal and breath sounds normal. No respiratory distress.  Abdominal: Soft. Bowel sounds are normal. She exhibits no distension. There is no tenderness.  Musculoskeletal:  Left arm in sling  Neurological: She is alert and oriented to person, place, and time.  Skin: Skin is warm and dry. Capillary refill takes less than 2 seconds.  Psychiatric: Cognition and memory are impaired.    Vital Signs: BP (!) 182/71 (BP Location: Right Arm)   Pulse 87   Temp 98.3 F (36.8 C) (Oral)   Resp 16   Ht 5' 1" (1.549 m)   Wt 52.2 kg (115 lb)   SpO2 98%   BMI 21.73 kg/m  Pain Scale: 0-10   Pain Score: 0-No pain   SpO2: SpO2: 98 % O2 Device:SpO2: 98 % O2 Flow Rate: .O2 Flow Rate (L/min): 4 L/min  IO: Intake/output summary:   Intake/Output Summary (Last 24 hours) at 05/27/2017 1033 Last data filed at 05/27/2017 0946 Gross per 24 hour  Intake 1908.75 ml  Output 725 ml  Net 1183.75 ml    LBM: Last BM Date: 05/23/17 Baseline Weight: Weight: 52.2 kg (115 lb) Most recent weight: Weight: 52.2 kg (115 lb)     Palliative Assessment/Data: PPS 50%     Time In: 1315 Time Out: 1500 Time Total: 105 minutes Greater than 50%  of this time was spent counseling and coordinating care related to the above assessment and plan.  Juel Burrow, DNP, AGNP-C Palliative Medicine Team [phone]

## 2017-05-27 NOTE — Evaluation (Signed)
Occupational Therapy Evaluation Patient Details Name: Meagan Osborn MRN: [id number] DOB: [birth year] Today's Date: 05/27/2017    History of Present Illness Pt is a 84 y.o. female admitted 05/23/17 post-unwitnessed fall at home without LOC, sustaining L humerus fx, L pubic ramus fx and L 6th rib fx. Head CT shows L parietal scalp contusion without underlying skull fx; chronic appearing small vessel ischemia. Now s/p L humerus ORIF 4/15. PMH includes HTN, memory loss, CKD IV, asthma, anxiety, osteoporosis.    Clinical Impression   Pt with decline in safety and function with ADLs and ADL mobility with decreased strength, cognition, safety, balance and endurance. Pt with impaired L UE function due to immobilization in sling from ORIF L humeral fx. Pt educated pt and daughters on sling wear, ADL techniques, AROM L UE to tolerance allowe by MD. Current sling not fittng properly, RN notified and requested ortho to deliver proper sling. Pt would benefit from acute OT services to address impairments to maximize level of function and safety    Follow Up Recommendations  SNF;Supervision/Assistance - 24 hour    Equipment Recommendations  None recommended by OT;Other (comment)(TBD at next venue of care)    Recommendations for Other Services       Precautions / Restrictions Precautions Precautions: Fall;Shoulder;Other (comment) Type of Shoulder Precautions: L rib fx Shoulder Interventions: Shoulder sling/immobilizer;At all times Precaution Comments: No AROM/PROM L shoulder. Ok for AROM elbow, wrist and hand to tolerance per MD Required Braces or Orthoses: Sling Restrictions Weight Bearing Restrictions: Yes LUE Weight Bearing: Non weight bearing LLE Weight Bearing: Weight bearing as tolerated      Mobility Bed Mobility Overal bed mobility: Needs Assistance Bed Mobility: Supine to Sit     Supine to sit: Max assist     General bed mobility comments: Pt able to initiate bringing BLEs to EOB  and attempting to push with RUE, requiring maxA for trunk elevation and to scoot hips to EOB  Transfers Overall transfer level: Needs assistance Equipment used: 1 person hand held assist Transfers: Sit to/from [employer]Stand;Stand Pivot Transfers Sit to Stand: Mod assist;+2 physical assistance;From elevated surface Stand pivot transfers: Max assist;+2 physical assistance       General transfer comment: ModA+2 to assist trunk elevation and cue fully upright posture; L knee instability requiring assist to prevent buckling. Pt able to take few shuffling steps towards chair with L knee buckling, requiring maxA+2 to eventually stand pivot due to fatigue    Balance Overall balance assessment: Needs assistance Sitting-balance support: Single extremity supported;Feet supported Sitting balance-Leahy Scale: Fair       Standing balance-Leahy Scale: Poor Standing balance comment: Requires HHA and external assist                           ADL either performed or assessed with clinical judgement   ADL Overall ADL's : Needs assistance/impaired Eating/Feeding: Moderate assistance;Sitting   Grooming: Moderate assistance;Sitting   Upper Body Bathing: Sitting;Total assistance   Lower Body Bathing: Total assistance;Sitting/lateral leans   Upper Body Dressing : Total assistance;Sitting   Lower Body Dressing: Total assistance;Sit to/from stand   Toilet Transfer: Moderate assistance;+2 for physical assistance;+2 for safety/equipment;Cueing for sequencing;Cueing for safety;Stand-pivot   Toileting- Clothing Manipulation and Hygiene: Total assistance       Functional mobility during ADLs: Moderate assistance;Maximal assistance;+2 for physical assistance;+2 for safety/equipment;Cueing for sequencing;Cueing for safety General ADL Comments: educated pt and daughters on sling wear, ADL techniques, AROM L UE  to tolerance allowe by MD. Current sling not fittng properly, RN notified and requested  ortho to deliver proper sling     Vision Baseline Vision/History: Wears glasses Wears Glasses: Reading only Patient Visual Report: No change from baseline       Perception     Praxis      Pertinent Vitals/Pain Pain Assessment: Faces Faces Pain Scale: Hurts even more Pain Location: L shoulder Pain Descriptors / Indicators: Sore;Guarding;Grimacing Pain Intervention(s): Limited activity within patient's tolerance;Monitored during session;Repositioned     Hand Dominance Right   Extremity/Trunk Assessment Upper Extremity Assessment Upper Extremity Assessment: Generalized weakness;LUE deficits/detail LUE Deficits / Details: s/p L humerus ORIF LUE: Unable to fully assess due to immobilization   Lower Extremity Assessment Lower Extremity Assessment: Defer to PT evaluation RLE Deficits / Details: Strength grossly 4/5 throughout LLE Deficits / Details: L pubic ramus fx; strength grossly 3/5 throughout; L knee buckling in WB   Cervical / Trunk Assessment Cervical / Trunk Assessment: Kyphotic   Communication Communication Communication: HOH   Cognition Arousal/Alertness: Awake/alert Behavior During Therapy: WFL for tasks assessed/performed Overall Cognitive Status: History of cognitive impairments - at baseline Area of Impairment: Attention;Memory;Following commands;Safety/judgement;Awareness;Problem solving                   Current Attention Level: Sustained Memory: Decreased short-term memory;Decreased recall of precautions Following Commands: Follows one step commands with increased time;Follows multi-step commands inconsistently Safety/Judgement: Decreased awareness of safety;Decreased awareness of deficits Awareness: Emergent Problem Solving: Slow processing;Requires verbal cues     General Comments  Two daughters present throughout session    Exercises     Shoulder Instructions      Home Living Family/patient expects to be discharged to:: Private  residence Living Arrangements: Children Available Help at Discharge: Family;Available 24 hours/day Type of Home: House Home Access: Stairs to enter [employer] of Steps: 5-6   Home Layout: One level     Bathroom Shower/Tub: Chief Strategy Officer: Standard     Home Equipment: Cane - single point;Wheelchair - manual   Additional Comments: Lives with daughter and son-in-law who are available for 24/7 support      Prior Functioning/Environment Level of Independence: Independent;Needs assistance  Gait / Transfers Assistance Needed: Daughter reports mostly indep with amb and ADLs; no DME. Family noticed increased "weakness" over winter with pt less active, so pt had been working with HHPT              OT Problem List: Decreased strength;Decreased activity tolerance;Decreased cognition;Decreased knowledge of use of DME or AE;Impaired UE functional use;Decreased range of motion;Impaired balance (sitting and/or standing);Decreased coordination;Decreased safety awareness;Decreased knowledge of precautions;Pain      OT Treatment/Interventions: Self-care/ADL training;DME and/or AE instruction;Therapeutic activities;Therapeutic exercise;Neuromuscular education;Patient/family education    OT Goals(Current goals can be found in the care plan section) Acute Rehab OT Goals Patient Stated Goal: Get stronger OT Goal Formulation: With patient/family Time For Goal Achievement: 06/10/17 Potential to Achieve Goals: Good  OT Frequency: Min 2X/week   Barriers to D/C: Decreased caregiver support  pt and family planing for SNF rehab after acute d/c before returning home       Co-evaluation PT/OT/SLP Co-Evaluation/Treatment: Yes Reason for Co-Treatment: Necessary to address cognition/behavior during functional activity;For patient/therapist safety;To address functional/ADL transfers PT goals addressed during session: Mobility/safety with mobility;Balance OT goals  addressed during session: ADL's and self-care      AM-PAC PT "6 Clicks" Daily Activity     Outcome Measure Help from  another person eating meals?: A Lot Help from another person taking care of personal grooming?: A Lot Help from another person toileting, which includes using toliet, bedpan, or urinal?: Total Help from another person bathing (including washing, rinsing, drying)?: Total Help from another person to put on and taking off regular upper body clothing?: Total Help from another person to put on and taking off regular lower body clothing?: Total 6 Click Score: 8   End of Session Equipment Utilized During Treatment: Gait belt Nurse Communication: Mobility status;Other (comment)(needs appropriate L UE sling from ortho)  Activity Tolerance: No increased pain;Patient limited by fatigue Patient left: in chair;with call bell/phone within reach;with chair alarm set;with family/visitor present  OT Visit Diagnosis: Unsteadiness on feet (R26.81);Other abnormalities of gait and mobility (R26.89);Muscle weakness (generalized) (M62.81);History of falling (Z91.81);Pain Pain - Right/Left: Left Pain - part of body: Shoulder(pubic rami, ribs)                Time: 0981-1914 OT Time Calculation (min): 40 min Charges:  OT General Charges $OT Visit: 1 Visit OT Evaluation $OT Eval Moderate Complexity: 1 Mod OT Treatments $Therapeutic Activity: 8-22 mins G-Codes: OT G-codes **NOT FOR INPATIENT CLASS** Functional Assessment Tool Used: AM-PAC 6 Clicks Daily Activity     Galen Manila 05/27/2017, 1:12 PM

## 2017-05-27 NOTE — Progress Notes (Addendum)
Patient ID: Meagan Osborn, female   DOB: August 17, 1920, 84 y.o.   MRN: [id number]     Subjective:  Patient reports pain as mild to moderate.  Patient in the bed and in no acute distress  Objective:   VITALS:   Vitals:   05/27/17 0246 05/27/17 0455 05/27/17 0946 05/27/17 1153  BP: (!) 180/76 (!) 158/69 (!) 182/71   Pulse: (!) 128 (!) 130 87 99  Resp:  16 16 18   Temp:  98 F (36.7 C) 98.3 F (36.8 C)   TempSrc:  Oral Oral   SpO2:  99% 98% 94%  Weight:      Height:        ABD soft Sensation intact distally Dorsiflexion/Plantar flexion intact Incision: dressing C/D/I and scant drainage   Lab Results  Component Value Date   WBC 15.9 (H) 05/27/2017   HGB 9.7 (L) 05/27/2017   HCT 30.6 (L) 05/27/2017   MCV 93.0 05/27/2017   PLT 212 05/27/2017   BMET    Component Value Date/Time   NA 143 05/27/2017 0608   K 4.1 05/27/2017 0608   CL 110 05/27/2017 0608   CO2 22 05/27/2017 0608   GLUCOSE 111 (H) 05/27/2017 0608   BUN 40 (H) 05/27/2017 0608   CREATININE 1.94 (H) 05/27/2017 0608   CALCIUM 7.9 (L) 05/27/2017 0608   GFRNONAA 21 (L) 05/27/2017 [id number]   GFRAA 24 (L) 05/27/2017 [id number]     Assessment/Plan: 1 Day Post-Op   Principal Problem:   Closed fracture of left proximal humerus Active Problems:   Left rib fracture   Pubic ramus fracture, left, closed, initial encounter (HCC)   Pelvic fracture (HCC)   CKD (chronic kidney disease), stage IV (HCC)   Dysphagia   Goals of care, counseling/discussion   Palliative care by specialist   Advance diet Up with therapy WBAT BLE, NWB LUE Dry dressing PRN Continue plan per medicine       Haskel KhanDOUGLAS PARRY, BRANDON 05/27/2017, 5:48 PM  Discussed and agree with above.   Teryl LucyJoshua Dariah Mcsorley, MD Cell [phone]

## 2017-05-27 NOTE — Progress Notes (Signed)
Pt maintaining a HR in 130s.MD made aware. Will continue to monitor.

## 2017-05-27 NOTE — Progress Notes (Signed)
  Speech Language Pathology Treatment: Dysphagia  Patient Details Name: Meagan Osborn MRN: [id number] DOB: [birth year] Today's Date: 05/27/2017 Time: 1240-1310 SLP Time Calculation (min) (ACUTE ONLY): 30 min  Assessment / Plan / Recommendation Clinical Impression  Pt alert, sitting in recliner after OT/PT session, daughters present. Pt's swallowing function remains consistent with results of Jan 2019 MBS, which revealed prolonged oral preparation and holding, piecemeal swallowing, but adequate airway protection. Daughter, [name], prefers to continue regular diet to maximize food choices.  Family is scheduled to meet with Palliative Care this afternoon.   We discussed cognitive decline and impact on eating/swallowing.  For today, continue current diet.  SLP will f/u after GOC meeting for further education.    HPI HPI: Meagan BostonDelia P Roam is a 84 y.o. female with medical history significant for asthma, CKD4, who presented to the ED after patient sustained a fall at home. Suffered Left proximal humerus fracture, s/p L humerus ORIF 4/15,  left 6th rib fracture, pubic rami fracture. Per chart patient lives with her daughter. Recent congestion and some cough and poor p.o., oral pocketing and holding. Per MD note daughter reports some short term memory problems, worse over the past few months but has not been formally diagnosed with dementia. MBS 02/2017 oral dysphagia, holding, piecemealing, prominent cricopharyngeus and reg/thin recommended.       SLP Plan  Continue with current plan of care       Recommendations  Diet recommendations: Regular;Thin liquid Liquids provided via: Cup;Straw Medication Administration: Crushed with puree Supervision: Patient able to self feed;Staff to assist with self feeding Compensations: Minimize environmental distractions                Oral Care Recommendations: Oral care BID SLP Visit Diagnosis: Dysphagia, oral phase (R13.11) Plan: Continue with current plan  of care       GO                Blenda MountsCouture, Royalti Schauf Laurice 05/27/2017, 1:46 PM

## 2017-05-28 ENCOUNTER — Inpatient Hospital Stay (HOSPITAL_COMMUNITY): Payer: Medicare Other

## 2017-05-28 DIAGNOSIS — R1310 Dysphagia, unspecified: Secondary | ICD-10-CM

## 2017-05-28 LAB — CBC
HEMATOCRIT: 28.7 % — AB (ref 36.0–46.0)
Hemoglobin: 9.3 g/dL — ABNORMAL LOW (ref 12.0–15.0)
MCH: 30.3 pg (ref 26.0–34.0)
MCHC: 32.4 g/dL (ref 30.0–36.0)
MCV: 93.5 fL (ref 78.0–100.0)
Platelets: 201 10*3/uL (ref 150–400)
RBC: 3.07 MIL/uL — ABNORMAL LOW (ref 3.87–5.11)
RDW: 16.3 % — AB (ref 11.5–15.5)
WBC: 14.1 10*3/uL — ABNORMAL HIGH (ref 4.0–10.5)

## 2017-05-28 LAB — BASIC METABOLIC PANEL
Anion gap: 7 (ref 5–15)
BUN: 44 mg/dL — ABNORMAL HIGH (ref 6–20)
CALCIUM: 7.9 mg/dL — AB (ref 8.9–10.3)
CO2: 21 mmol/L — AB (ref 22–32)
CREATININE: 1.81 mg/dL — AB (ref 0.44–1.00)
Chloride: 110 mmol/L (ref 101–111)
GFR calc non Af Amer: 23 mL/min — ABNORMAL LOW (ref >60)
GFR, EST AFRICAN AMERICAN: 26 mL/min — AB (ref >60)
GLUCOSE: 122 mg/dL — AB (ref 65–99)
Potassium: 4.7 mmol/L (ref 3.5–5.1)
Sodium: 138 mmol/L (ref 135–145)

## 2017-05-28 MED ORDER — DOCUSATE SODIUM 50 MG/5ML PO LIQD
100.0000 mg | Freq: Two times a day (BID) | ORAL | Status: DC
  Filled 2017-05-28: qty 10

## 2017-05-28 MED ORDER — ACETAMINOPHEN 500 MG PO TABS: 1000.0000 mg | ORAL_TABLET | Freq: Three times a day (TID) | ORAL | Status: DC

## 2017-05-28 MED ORDER — ACETAMINOPHEN 650 MG RE SUPP
650.0000 mg | Freq: Four times a day (QID) | RECTAL | Status: DC
  Administered 2017-05-28 – 2017-05-30 (×6): 650 mg via RECTAL
  Filled 2017-05-28 (×8): qty 1

## 2017-05-28 MED ORDER — METOPROLOL TARTRATE 25 MG/10 ML ORAL SUSPENSION
25.0000 mg | Freq: Two times a day (BID) | ORAL | Status: DC
  Administered 2017-05-28 – 2017-05-31 (×7): 25 mg via ORAL
  Filled 2017-05-28 (×7): qty 10

## 2017-05-28 MED ORDER — FUROSEMIDE 10 MG/ML IJ SOLN
40.0000 mg | Freq: Once | INTRAMUSCULAR | Status: AC
  Administered 2017-05-28: 40 mg via INTRAVENOUS

## 2017-05-28 MED ORDER — CYANOCOBALAMIN 1000 MCG/ML IJ SOLN
1000.0000 ug | Freq: Once | INTRAMUSCULAR | Status: AC
  Administered 2017-05-28: 1000 ug via INTRAMUSCULAR
  Filled 2017-05-28: qty 1

## 2017-05-28 MED ORDER — METOPROLOL TARTRATE 5 MG/5ML IV SOLN
5.0000 mg | Freq: Four times a day (QID) | INTRAVENOUS | Status: DC | PRN
  Filled 2017-05-28: qty 5

## 2017-05-28 NOTE — Progress Notes (Signed)
Patient doing much better, pain is better controlled, mobility better, cognitive function better.  On exam sensation intact distally in her surgical wounds are clean with dressing intact.  Impression left proximal humerus fracture rib fracture and pelvic fracture plan doing well weight-bear as tolerated both lower extremities nonweightbearing left upper extremity.  Return to clinic with me in 2 weeks.  Okay to anticoagulate if necessary from a mobilization standpoint.  Tylenol for pain control.

## 2017-05-28 NOTE — Progress Notes (Signed)
PROGRESS NOTE    Meagan Osborn  ZOX:[id number] DOB: Apr 03, 1920 DOA: 05/23/2017 PCP: Barbie Banner, MD     Brief Narrative:  Meagan Osborn a 84 y.o.femalewith medical history significantforasthma, CKD stage 4,progressive memory loss, who presented to the ED after patient sustained a fall at home.Patient lives with her daughter.Patient got up from chair and was walking towards the kitchen when she suddenly fell.Fall was unwitnessed. When daughter heard the fall and came to her aid, patient had not lost consciousness and no witnessed seizure activities. Daughter immediately called EMS. Patient's has chronic orthostatic dizziness,son-in-law suspects thismay her caused her fall, as patient had just gotten up from sitting position. Work up in the ED revealed multiple fractures including left pubic rami, left humerus, and left 6th rib. S/p repair but slow to recover due to on-going dysphagia.    Assessment & Plan:   Principal Problem:   Closed fracture of left proximal humerus Active Problems:   Left rib fracture   Pubic ramus fracture, left, closed, initial encounter (HCC)   Pelvic fracture (HCC)   CKD (chronic kidney disease), stage IV (HCC)   Dysphagia   Goals of care, counseling/discussion   Palliative care by specialist   Left proximal humerus fracture, left 6th rib fracture, pubic rami fracture s/p mechanical fall without loss of consciousness -Orthopedic surgery consulted, s/p OR 4/15 -Will need significant rehab post-discharge -s/p 2u pRBC with ? Reaction of elevated BP-- having to check BP on legs so not sure how accurate -echo: - Normal LV size with mild LV hypertrophy. EF 55-60%. Moderate   diastolic dysfunction. Normal RV size and systolic function.   Aortic valve sclerosis without significant stenosis. Calcified   mitral valve and annulus. There was mild mitral regurgitation and   probably mild mitral stenosis. Moderate pulmonary hypertension. -plan for  scheduled tylenol suppository for now until able to take PO -SNF placement once eating better  CKD stage 4 -Baseline Cr in 2015-2017 has been 1.5-1.9 -Continue to monitor urine output -doubt would be candidate for HD  Acute resp failure -needs 2L O2  HTN -IV scheduled and PO if able to take  Hypothyroidism -Continue synthroid  Anxiety -Continue zoloft  Memory loss -Progressive memory loss at home, although quite functional and independent. No formal dx of dementia although family notes mentation decline over the past several months  -B12 low-- replace IM  Dysphagia ->6 month history -has had outpatient SLP eval and barium swallow -appears to be more related to memory issues-- pocketing food -palliative care discussion with daughter and consult appreciated  Patient will needs SNF but not able to eat-- will check dg chest as she is requiring O2 which is new-- unable to position patient to clearly hear lungs in back   DVT prophylaxis: SCD  Code Status: DNR Family Communication: Daughter/HCPOA at bedside Disposition Plan: SNF when eating more   Consultants:   Orthopedic surgery  Palliative care    Antimicrobials:  Anti-infectives (From admission, onward)   Start     Dose/Rate Route Frequency Ordered Stop   05/26/17 2230  ceFAZolin (ANCEF) IVPB 1 g/50 mL premix     1 g 100 mL/hr over 30 Minutes Intravenous Every 6 hours 05/26/17 2036 05/27/17 1154   05/26/17 1527  ceFAZolin (ANCEF) 2-4 GM/100ML-% IVPB    Note to Pharmacy:  Sabino Niemann   : cabinet override      05/26/17 1527 05/27/17 0344   05/25/17 0600  ceFAZolin (ANCEF) IVPB 2g/100 mL premix  Status:  Discontinued     2 g 200 mL/hr over 30 Minutes Intravenous On call to O.R. 05/24/17 2044 05/25/17 1319       Subjective: Per daughter not able to take pills crushed in apple sauce  Objective: Vitals:   05/28/17 0433 05/28/17 0520 05/28/17 0916 05/28/17 1045  BP: (!) 160/80   (!) 155/69  Pulse: (!)  136  (!) 136 94  Resp: 19  (!) 22 16  Temp:    98.2 F (36.8 C)  TempSrc:    Oral  SpO2: (!) 89% 95% 97% 98%  Weight:      Height:        Intake/Output Summary (Last 24 hours) at 05/28/2017 1308 Last data filed at 05/28/2017 0900 Gross per 24 hour  Intake 2160 ml  Output 1350 ml  Net 810 ml   Filed Weights   05/23/17 2145  Weight: 52.2 kg (115 lb)    Examination:  General exam: frail, elderly female in bed Respiratory system: mild increase work of breathing, no wheezing Cardiovascular system: tachy, + murmur Gastrointestinal system: soft, diminished bowel sounds Central nervous system: will awaken with stimulation, no focal deficits     Data Reviewed: I have personally reviewed following labs and imaging studies  CBC: Recent Labs  Lab 05/23/17 2140 05/24/17 0545 05/25/17 0436 05/26/17 0235 05/27/17 0608 05/28/17 0652  WBC 14.4* 14.0* 10.7* 15.5* 15.9* 14.1*  NEUTROABS 11.6*  --   --   --   --   --   HGB 9.9* 8.9* 7.2* 10.3* 9.7* 9.3*  HCT 30.6* 27.0* 22.6* 31.8* 30.6* 28.7*  MCV 96.2 96.4 98.7 93.5 93.0 93.5  PLT 224 199 164 198 212 201   Basic Metabolic Panel: Recent Labs  Lab 05/24/17 0544 05/25/17 0436 05/26/17 0235 05/27/17 0608 05/28/17 0652  NA 138 140 141 143 138  K 4.3 4.0 4.0 4.1 4.7  CL 104 109 108 110 110  CO2 26 22 22 22  21*  GLUCOSE 122* 98 128* 111* 122*  BUN 25* 28* 29* 40* 44*  CREATININE 1.48* 1.77* 1.88* 1.94* 1.81*  CALCIUM 8.0* 7.2* 8.0* 7.9* 7.9*   GFR: Estimated Creatinine Clearance: 14.3 mL/min (A) (by C-G formula based on SCr of 1.81 mg/dL (H)). Liver Function Tests: Recent Labs  Lab 05/23/17 2140  AST 19  ALT 18  ALKPHOS 52  BILITOT 0.6  PROT 6.3*  ALBUMIN 3.3*   No results for input(s): LIPASE, AMYLASE in the last 168 hours. No results for input(s): AMMONIA in the last 168 hours. Coagulation Profile: No results for input(s): INR, PROTIME in the last 168 hours. Cardiac Enzymes: Recent Labs  Lab 05/23/17 2140  05/24/17 0300 05/24/17 0930  TROPONINI 0.05* 0.05* 0.04*   BNP (last 3 results) No results for input(s): PROBNP in the last 8760 hours. HbA1C: No results for input(s): HGBA1C in the last 72 hours. CBG: Recent Labs  Lab 05/25/17 1518  GLUCAP 117*   Lipid Profile: No results for input(s): CHOL, HDL, LDLCALC, TRIG, CHOLHDL, LDLDIRECT in the last 72 hours. Thyroid Function Tests: Recent Labs    05/26/17 0235  TSH 4.152   Anemia Panel: Recent Labs    05/26/17 0235  VITAMINB12 195   Sepsis Labs: No results for input(s): PROCALCITON, LATICACIDVEN in the last 168 hours.  Recent Results (from the past 240 hour(s))  Surgical pcr screen     Status: None   Collection Time: 05/24/17 10:26 PM  Result Value Ref Range Status   MRSA, PCR NEGATIVE NEGATIVE  Final   Staphylococcus aureus NEGATIVE NEGATIVE Final    Comment: (NOTE) The Xpert SA Assay (FDA approved for NASAL specimens in patients 82 years of age and older), is one component of a comprehensive surveillance program. It is not intended to diagnose infection nor to guide or monitor treatment. Performed at [facility], [address] [place], [place] [address]        Radiology Studies: Dg Humerus Left  Result Date: 05/26/2017 CLINICAL DATA:  84 year old female with left humerus fracture. Subsequent encounter. EXAM: DG C-ARM 61-120 MIN; LEFT HUMERUS - 2+ VIEW Fluoroscopic time: 55 seconds. COMPARISON:  05/24/2017. FINDINGS: Two intraoperative C-arm views submitted for review after surgery. This reveals plate and screws transfixing left humeral surgical neck fracture. IMPRESSION: Open reduction and internal fixation left humeral surgical neck fracture. Electronically Signed   By: Lacy DuverneySteven  Olson M.D.   On: 05/26/2017 18:17   Dg C-arm 1-60 Min  Result Date: 05/26/2017 CLINICAL DATA:  84 year old female with left humerus fracture. Subsequent encounter. EXAM: DG C-ARM 61-120 MIN; LEFT HUMERUS - 2+ VIEW  Fluoroscopic time: 55 seconds. COMPARISON:  05/24/2017. FINDINGS: Two intraoperative C-arm views submitted for review after surgery. This reveals plate and screws transfixing left humeral surgical neck fracture. IMPRESSION: Open reduction and internal fixation left humeral surgical neck fracture. Electronically Signed   By: Lacy DuverneySteven  Olson M.D.   On: 05/26/2017 18:17   Dg C-arm 1-60 Min  Result Date: 05/26/2017 CLINICAL DATA:  84 year old female with left humerus fracture. Subsequent encounter. EXAM: DG C-ARM 61-120 MIN; LEFT HUMERUS - 2+ VIEW Fluoroscopic time: 55 seconds. COMPARISON:  05/24/2017. FINDINGS: Two intraoperative C-arm views submitted for review after surgery. This reveals plate and screws transfixing left humeral surgical neck fracture. IMPRESSION: Open reduction and internal fixation left humeral surgical neck fracture. Electronically Signed   By: Lacy DuverneySteven  Olson M.D.   On: 05/26/2017 18:17      Scheduled Meds: . acetaminophen  650 mg Rectal Q6H  . docusate  100 mg Oral BID  . enoxaparin (LOVENOX) injection  30 mg Subcutaneous Q24H  . feeding supplement (ENSURE ENLIVE)  237 mL Oral BID BM  . levothyroxine  25 mcg Intravenous Daily  . metoprolol tartrate  25 mg Oral BID  . mometasone-formoterol  2 puff Inhalation BID  . sertraline  25 mg Oral Daily   Continuous Infusions:    LOS: 4 days    Time spent: 35 minutes   Joseph ArtJessica U Ivonna Kinnick, DO [facility] [email or web address] Password [email or web address] 05/28/2017, 1:08 PM

## 2017-05-28 NOTE — [place] FL2 (Signed)
[place] MEDICAID FL2 LEVEL OF CARE SCREENING TOOL     IDENTIFICATION  Patient Name: Meagan Osborn Birthdate: 1920-07-15 Sex: female Admission Date (Current Location): 05/23/2017  [place] [facility]   Facility and Address:  The . [facility], [address], [place], [place] [address]      Provider Number: [id number]  Attending Physician Name and Address:  Joseph ArtVann, Jessica U, DO  Relative Name and Phone Number:  Alma DownsSusan Wingo (daughter) [phone]    Current Level of Care: Hospital Recommended Level of Care: Skilled Nursing Facility Prior Approval Number:    Date Approved/Denied:   PASRR Number: (Submitted for PASRR 05/28/2017)  Discharge Plan: SNF    Current Diagnoses: Patient Active Problem List   Diagnosis Date Noted  . Dysphagia   . Goals of care, counseling/discussion   . Palliative care by specialist   . Pelvic fracture (HCC) 05/24/2017  . CKD (chronic kidney disease), stage IV (HCC) 05/24/2017  . Closed fracture of left proximal humerus 05/23/2017  . Left rib fracture 05/23/2017  . Pubic ramus fracture, left, closed, initial encounter (HCC) 05/23/2017  . Swallowing impaired 12/13/2016  . Pulmonary nodule 11/27/2015  . Allergic rhinitis 09/26/2014  . Asthma, chronic 08/16/2014    Orientation RESPIRATION BLADDER Height & Weight     Self  Normal External catheter Weight: 115 lb (52.2 kg) Height:  5\' 1"  (154.9 cm)  BEHAVIORAL SYMPTOMS/MOOD NEUROLOGICAL BOWEL NUTRITION STATUS      Continent    AMBULATORY STATUS COMMUNICATION OF NEEDS Skin   Extensive Assist (left humerus fx)  Verbally Other (Comment)(Ecchymosis (left arm, eye, face); surgical incision; )                       Personal Care Assistance Level of Assistance  Bathing, Feeding, Dressing Bathing Assistance: Limited assistance Feeding assistance: Limited assistance Dressing Assistance: Limited assistance     Functional Limitations Info  Sight,  Hearing, Speech Sight Info: Impaired  Hearing Info: Adequate Speech Info: Adequate    SPECIAL CARE FACTORS FREQUENCY  PT (By licensed PT), OT (By licensed OT)     PT Frequency: Evaluated 04/17 and a minimum 3x per week recommended during acute inpatient stay.  OT Frequency: Evaluated 04/16 and a minimum 2x per week recommended during acute inpatient stay.             Contractures Contractures Info: Not present    Additional Factors Info  Code Status, Allergies Code Status Info: DNR Allergies Info: Sulfa Antibiotics           Current Medications (05/28/2017):  This is the current hospital active medication list Current Facility-Administered Medications  Medication Dose Route Frequency Provider Last Rate Last Dose  . acetaminophen (TYLENOL) suppository 650 mg  650 mg Rectal Q6H Vann, Jessica U, DO   650 mg at 05/28/17 1318  . alum & mag hydroxide-simeth (MAALOX/MYLANTA) 200-200-20 MG/5ML suspension 30 mL  30 mL Oral Q4H PRN Teryl LucyLandau, Joshua, MD      . bisacodyl (DULCOLAX) suppository 10 mg  10 mg Rectal Daily PRN Teryl LucyLandau, Joshua, MD      . enoxaparin (LOVENOX) injection 30 mg  30 mg Subcutaneous Q24H Teryl LucyLandau, Joshua, MD   30 mg at 05/28/17 0826  . feeding supplement (ENSURE ENLIVE) (ENSURE ENLIVE) liquid 237 mL  237 mL Oral BID BM Teryl LucyLandau, Joshua, MD   237 mL at 05/28/17 1400  . hydrALAZINE (APRESOLINE) injection 10 mg  10 mg Intravenous Q4H PRN  Teryl Lucy, MD   10 mg at 05/27/17 2244  . levalbuterol (XOPENEX) nebulizer solution 0.63 mg  0.63 mg Nebulization Q6H PRN Teryl Lucy, MD   0.63 mg at 05/28/17 0916  . levothyroxine (SYNTHROID, LEVOTHROID) injection 25 mcg  25 mcg Intravenous Daily Teryl Lucy, MD   25 mcg at 05/28/17 1136  . magnesium citrate solution 1 Bottle  1 Bottle Oral Once PRN Teryl Lucy, MD      . menthol-cetylpyridinium (CEPACOL) lozenge 3 mg  1 lozenge Oral PRN Teryl Lucy, MD       Or  . phenol (CHLORASEPTIC) mouth spray 1 spray  1 spray  Mouth/Throat PRN Teryl Lucy, MD      . metoprolol tartrate (LOPRESSOR) 25 mg/10 mL oral suspension 25 mg  25 mg Oral BID Vann, Jessica U, DO   25 mg at 05/28/17 1134  . metoprolol tartrate (LOPRESSOR) injection 5 mg  5 mg Intravenous Q6H PRN Vann, Jessica U, DO      . mometasone-formoterol (DULERA) 100-5 MCG/ACT inhaler 2 puff  2 puff Inhalation BID Teryl Lucy, MD   2 puff at 05/28/17 0916  . morphine 2 MG/ML injection 0.5 mg  0.5 mg Intravenous Q2H PRN Benjamine Mola, Jessica U, DO      . ondansetron (ZOFRAN) tablet 4 mg  4 mg Oral Q6H PRN Teryl Lucy, MD       Or  . ondansetron [facility]) injection 4 mg  4 mg Intravenous Q6H PRN Teryl Lucy, MD   4 mg at 05/27/17 1645  . polyethylene glycol (MIRALAX / GLYCOLAX) packet 17 g  17 g Oral Daily PRN Teryl Lucy, MD      . sertraline (ZOLOFT) tablet 25 mg  25 mg Oral Daily Teryl Lucy, MD   25 mg at 05/28/17 1135     Discharge Medications: Please see discharge summary for a list of discharge medications.  Relevant Imaging Results:  Relevant Lab Results:   Additional Information ss# [id number]  Maxwell Marion Work [phone]

## 2017-05-28 NOTE — Care Management Important Message (Signed)
Important Message  Patient Details  Name: Loma BostonDelia P Parcel MRN: [id number] Date of Birth: Oct 08, 1920   Medicare Important Message Given:  Yes    Natasha Burda Stefan ChurchBratton 05/28/2017, 9:46 AM

## 2017-05-28 NOTE — Progress Notes (Signed)
Physical Therapy Treatment Patient Details Name: Meagan Osborn MRN: [id number] DOB: 09/23/1920 Today's Date: 05/28/2017    History of Present Illness Pt is a 84 y.o. female admitted 05/23/17 post-unwitnessed fall at home without LOC, sustaining L humerus fx, L pubic ramus fx and L 6th rib fx. Head CT shows L parietal scalp contusion without underlying skull fx; chronic appearing small vessel ischemia. Now s/p L humerus ORIF 4/15. PMH includes HTN, memory loss, CKD IV, asthma, anxiety, osteoporosis.    PT Comments    Pt slowly progressing with mobility. Performed stand pivot transfer and multiple sit<>stand trials; pt unable to achieve fully upright posture despite HHA and maxA. L knee buckling with increased WB. LUE adjusted in sling; current pediatric-sized sling too small, so ordered an adult-sized sling from ortho tech. Encouraged elbow, wrist, finger ROM and reminded pt/daughter of technique for this. SpO2 down to 87% on RA; 94% on 2L O2 Bell (RN notified). Continue to recommend SNF-level therapies. Will follow acutely.   Follow Up Recommendations  SNF;Supervision/Assistance - 24 hour     Equipment Recommendations  (TBD next venue)    Recommendations for Other Services       Precautions / Restrictions Precautions Precautions: Fall;Shoulder;Other (comment) Type of Shoulder Precautions: L rib fx Shoulder Interventions: Shoulder sling/immobilizer;At all times Precaution Comments: No AROM/PROM L shoulder. Ok for AROM elbow, wrist and hand to tolerance per MD Required Braces or Orthoses: Sling Restrictions Weight Bearing Restrictions: Yes LUE Weight Bearing: Non weight bearing LLE Weight Bearing: Weight bearing as tolerated    Mobility  Bed Mobility Overal bed mobility: Needs Assistance Bed Mobility: Supine to Sit     Supine to sit: Mod assist     General bed mobility comments: Improved ability to maneuver BLEs to EOB, requiring modA for trunk elevation; attempting to push  with RUE with instruction. Increased time and effort to scoot hips to EOB and backwards once in recliner  Transfers Overall transfer level: Needs assistance Equipment used: 1 person hand held assist Transfers: Sit to/from [employer] Sit to Stand: Max assist Stand pivot transfers: Max assist       General transfer comment: MaxA to stand pivot to chair. Performed 3x sit<>stand trials, pt requiring maxA to assist trunk elevation; unable to achieve fully upright posture despite multimodal cues; L knee buckling requiring maxA to prevent fall  Ambulation/Gait                 Stairs             Wheelchair Mobility    Modified Rankin (Stroke Patients Only)       Balance Overall balance assessment: Needs assistance Sitting-balance support: Single extremity supported;Feet supported Sitting balance-Leahy Scale: Fair       Standing balance-Leahy Scale: Poor Standing balance comment: Requires HHA and external assist                            Cognition Arousal/Alertness: Awake/alert Behavior During Therapy: WFL for tasks assessed/performed Overall Cognitive Status: History of cognitive impairments - at baseline Area of Impairment: Attention;Memory;Following commands;Safety/judgement;Awareness;Problem solving                   Current Attention Level: Sustained Memory: Decreased short-term memory;Decreased recall of precautions Following Commands: Follows one step commands with increased time;Follows multi-step commands inconsistently Safety/Judgement: Decreased awareness of safety;Decreased awareness of deficits Awareness: Emergent Problem Solving: Slow processing;Requires verbal cues General Comments: Pt lethargic this  session, improving somewhat sitting in chair. Easily distracted by "feeling uneasy" and "feeling weak" requiring multiple cues for redirection to task      Exercises      General Comments General comments (skin  integrity, edema, etc.): Daughter present during session      Pertinent Vitals/Pain Pain Assessment: Faces Faces Pain Scale: Hurts little more Pain Location: L shoulder Pain Descriptors / Indicators: Sore;Guarding;Grimacing Pain Intervention(s): Monitored during session;Repositioned    Home Living                      Prior Function            PT Goals (current goals can now be found in the care plan section) Acute Rehab PT Goals Patient Stated Goal: Get stronger PT Goal Formulation: With patient/family Time For Goal Achievement: 06/10/17 Potential to Achieve Goals: Good Progress towards PT goals: Progressing toward goals    Frequency    Min 3X/week      PT Plan Current plan remains appropriate    Co-evaluation              AM-PAC PT "6 Clicks" Daily Activity  Outcome Measure  Difficulty turning over in bed (including adjusting bedclothes, sheets and blankets)?: A Little Difficulty moving from lying on back to sitting on the side of the bed? : Unable Difficulty sitting down on and standing up from a chair with arms (e.g., wheelchair, bedside commode, etc,.)?: Unable Help needed moving to and from a bed to chair (including a wheelchair)?: A Lot Help needed walking in hospital room?: A Lot Help needed climbing 3-5 steps with a railing? : Total 6 Click Score: 10    End of Session Equipment Utilized During Treatment: Gait belt Activity Tolerance: Patient tolerated treatment well;Patient limited by fatigue Patient left: in chair;with call bell/phone within reach;with chair alarm set;with family/visitor present Nurse Communication: Mobility status PT Visit Diagnosis: Other abnormalities of gait and mobility (R26.89);Muscle weakness (generalized) (M62.81)     Time: [id number] PT Time Calculation (min) (ACUTE ONLY): 29 min  Charges:  $Therapeutic Activity: 23-37 mins                    G Codes:      Ina HomesJaclyn Elonna Mcfarlane, PT, DPT Acute Rehab Services   Pager: [phone]  Malachy ChamberJaclyn L Daniyal Tabor 05/28/2017, 10:52 AM

## 2017-05-28 NOTE — Clinical Social Work Note (Signed)
Clinical Social Work Assessment  Patient Details  Name: Meagan Osborn MRN: [id number] Date of Birth: 11-Jun-1920  Date of referral:  05/28/17               Reason for consult:  Discharge Planning, Facility Placement                Permission sought to share information with:  Facility Sport and exercise psychologist, Family Supports Permission granted to share information::  Yes, Release of Information Signed  Name::     [name]   Agency::     Relationship::  Daughter   Contact Information:  [phone]  Housing/Transportation Living arrangements for the past 2 months:  [place] of Information:  Medical Team Patient Interpreter Needed:  None Criminal Activity/Legal Involvement Pertinent to Current Situation/Hospitalization:  No - Comment as needed Significant Relationships:  Adult Children Lives with:  Adult Children Do you feel safe going back to the place where you live?  Yes(Patient needs short term rehab before returning home. ) Need for family participation in patient care:  No (Coment)  Care giving concerns:  Mrs. [name] did not express any concerns regarding her mother returning home after completing short term rehab.    Social Worker assessment / plan: CSW Intern met with patient at bedside. Also at the bedside [name] and [name] daughters of Mrs. Meagan Osborn. [name] was alert, but is only oriented to herself, she was sitting up in the bed eating. [name] was pleasant and engaged in the conversation regarding her mother's discharge disposition. [name] informed CSW Intern that patient has never been to a skilled nursing facility and will need time to research skilled facilities. CSW Intern stressed the important of having facility choices ready when patient is stable and ready for discharge and [name] indicated understanding. Daughter provided with skilled facility list for [place] and surrounding counties.    Employment status:   Retired Nurse, adult PT Recommendations:  [place] / Referral to community resources:   [place] facility list provided.   Patient/Family's Response to care: [name] expressed no concerns regarding care during hospitalization.   Patient/Family's Understanding of and Emotional Response to Diagnosis, Current Treatment, and Prognosis:  [name] is understanding of her mother's need for short term rehab and has agreed for her to receive rehab services at a skilled nurisng facility.   Emotional Assessment Appearance:  Appears stated age Attitude/Demeanor/Rapport:  Unable to Assess(Patient oriented to self only. ) Affect (typically observed):  Unable to Assess(Patient oriented to self only. ) Orientation:  Oriented to Self Alcohol / Substance use:  Never Used Psych involvement (Current and /or in the community):  No (Comment)  Discharge Needs  Concerns to be addressed:  Skilled nursing facility placement for short term rehab.  Readmission within the last 30 days:  No Current discharge risk:  None Barriers to Discharge: Continued medical work-up.   Ernesto Rutherford, Student-Social Work 05/28/2017, 2:43 PM

## 2017-05-28 NOTE — Progress Notes (Signed)
Orthopedic Tech Progress Note Patient Details:  Meagan Osborn Jun 27, 1920 [id number]  Ortho Devices Type of Ortho Device: Arm sling Ortho Device/Splint Location: lue Ortho Device/Splint Interventions: Application   Post Interventions Patient Tolerated: Well Instructions Provided: Care of device   Meagan Osborn, Meagan Osborn 05/28/2017, 11:18 AM

## 2017-05-29 DIAGNOSIS — J90 Pleural effusion, not elsewhere classified: Secondary | ICD-10-CM

## 2017-05-29 LAB — BASIC METABOLIC PANEL
ANION GAP: 9 (ref 5–15)
BUN: 49 mg/dL — ABNORMAL HIGH (ref 6–20)
CALCIUM: 8.2 mg/dL — AB (ref 8.9–10.3)
CO2: 23 mmol/L (ref 22–32)
Chloride: 110 mmol/L (ref 101–111)
Creatinine, Ser: 1.79 mg/dL — ABNORMAL HIGH (ref 0.44–1.00)
GFR calc Af Amer: 27 mL/min — ABNORMAL LOW (ref >60)
GFR, EST NON AFRICAN AMERICAN: 23 mL/min — AB (ref >60)
GLUCOSE: 113 mg/dL — AB (ref 65–99)
Potassium: 4.3 mmol/L (ref 3.5–5.1)
Sodium: 142 mmol/L (ref 135–145)

## 2017-05-29 LAB — CBC
HCT: 30 % — ABNORMAL LOW (ref 36.0–46.0)
Hemoglobin: 9.7 g/dL — ABNORMAL LOW (ref 12.0–15.0)
MCH: 30.3 pg (ref 26.0–34.0)
MCHC: 32.3 g/dL (ref 30.0–36.0)
MCV: 93.8 fL (ref 78.0–100.0)
PLATELETS: 233 10*3/uL (ref 150–400)
RBC: 3.2 MIL/uL — ABNORMAL LOW (ref 3.87–5.11)
RDW: 15.6 % — AB (ref 11.5–15.5)
WBC: 16.3 10*3/uL — AB (ref 4.0–10.5)

## 2017-05-29 MED ORDER — FUROSEMIDE 10 MG/ML IJ SOLN
40.0000 mg | Freq: Once | INTRAMUSCULAR | Status: AC
  Administered 2017-05-29: 40 mg via INTRAVENOUS
  Filled 2017-05-29: qty 4

## 2017-05-29 NOTE — Progress Notes (Signed)
Physical Therapy Treatment Patient Details Name: Meagan Osborn MRN: [id number] DOB: 03/29/1920 Today's Date: 05/29/2017    History of Present Illness Pt is a 84 y.o. female admitted 05/23/17 post-unwitnessed fall at home without LOC, sustaining L humerus fx, L pubic ramus fx and L 6th rib fx. Head CT shows L parietal scalp contusion without underlying skull fx; chronic appearing small vessel ischemia. Now s/p L humerus ORIF 4/15. PMH includes HTN, memory loss, CKD IV, asthma, anxiety, osteoporosis.     PT Comments    [facility] by [facility] PT assisted NT as RN was unavailable; pt continues to require  Max to total assist for basic mobility/transfers; pt is cooperative with therapy and will benefit from SNF post acute  Follow Up Recommendations  SNF;Supervision/Assistance - 24 hour     Equipment Recommendations  None recommended by PT(defer to SNF)    Recommendations for Other Services       Precautions / Restrictions Precautions Precautions: Fall;Shoulder Type of Shoulder Precautions: L rib fx Shoulder Interventions: Shoulder sling/immobilizer;At all times Precaution Comments: No AROM/PROM L shoulder. Ok for AROM elbow, wrist and hand to tolerance per MD Required Braces or Orthoses: Sling Restrictions Weight Bearing Restrictions: Yes LUE Weight Bearing: Non weight bearing LLE Weight Bearing: Weight bearing as tolerated    Mobility  Bed Mobility Overal bed mobility: Needs Assistance Bed Mobility: Sit to Supine     Supine to sit:  Sit to supine: Max assist   General bed mobility comments: assist with descent of trunk and LEs onto bed, cues to avoid use of LUE  Transfers Overall transfer level: Needs assistance   Transfers: Sit to/from Stand;Stand Pivot Transfers Sit to Stand: Max assist Stand pivot transfers: +2 physical assistance;Max assist       General transfer comment: pt requiring max assist to rise from chair and BSC, max +2 to stand and  pivot from recliner to [facility];  assist for knee/hip/trunk extension & to prevent knee buckling, unable to take steps to [facility]; multi-modal cues needed throughout for participation and sequencing  Ambulation/Gait             General Gait Details: unable   Stairs             Wheelchair Mobility    Modified Rankin (Stroke Patients Only)       Balance Overall balance assessment: Needs assistance Sitting-balance support: Single extremity supported;Feet supported Sitting balance-Leahy Scale: Fair       Standing balance-Leahy Scale: Zero Standing balance comment: pt with  bil knee buckling and hip flexion in standing, requiring support/max to total assist of  1 for NT to perform peri-care                            Cognition Arousal/Alertness: Awake/alert Behavior During Therapy: WFL for tasks assessed/performed Overall Cognitive Status: History of cognitive impairments - at baseline Area of Impairment: Attention;Memory;Following commands;Safety/judgement;Awareness;Problem solving                   Current Attention Level: Sustained Memory: Decreased short-term memory;Decreased recall of precautions Following Commands: Follows one step commands with increased time;Follows multi-step commands inconsistently Safety/Judgement: Decreased awareness of safety;Decreased awareness of deficits Awareness: Emergent Problem Solving: Slow processing;Requires verbal cues General Comments: awake and alert this session, requires incr time to follow commands, multi-modal cues needed      Exercises Other Exercises Other Exercises: AROM L elbow to hand x  10 in supine    General Comments        Pertinent Vitals/Pain Pain Assessment: Faces Faces Pain Scale: Hurts even more Pain Location: L shoulder, LLE Pain Descriptors / Indicators: Sore;Guarding;Grimacing Pain Intervention(s): Monitored during session;Limited activity within patient's tolerance;Repositioned     Home Living                      Prior Function            PT Goals (current goals can now be found in the care plan section) Acute Rehab PT Goals Patient Stated Goal: Get stronger PT Goal Formulation: With patient/family Time For Goal Achievement: 06/10/17 Potential to Achieve Goals: Good Progress towards PT goals: Progressing toward goals(slowly)    Frequency    Min 3X/week      PT Plan Current plan remains appropriate    Co-evaluation PT/OT/SLP Co-Evaluation/Treatment: Yes Reason for Co-Treatment: For patient/therapist safety          AM-PAC PT "6 Clicks" Daily Activity  Outcome Measure  Difficulty turning over in bed (including adjusting bedclothes, sheets and blankets)?: A Little Difficulty moving from lying on back to sitting on the side of the bed? : Unable Difficulty sitting down on and standing up from a chair with arms (e.g., wheelchair, bedside commode, etc,.)?: Unable Help needed moving to and from a bed to chair (including a wheelchair)?: Total Help needed walking in hospital room?: Total Help needed climbing 3-5 steps with a railing? : Total 6 Click Score: 8    End of Session Equipment Utilized During Treatment: Gait belt Activity Tolerance: Patient tolerated treatment well;Patient limited by fatigue Patient left: in chair;with call bell/phone within reach;with chair alarm set;with family/visitor present Nurse Communication: Mobility status PT Visit Diagnosis: Other abnormalities of gait and mobility (R26.89);Muscle weakness (generalized) (M62.81)     Time: 5621-3086 PT Time Calculation (min) (ACUTE ONLY): 25 min  Charges:  $Therapeutic Activity: 23-37 mins                    G CodesDrucilla Chalet, PT Pager: [phone] 05/29/2017    [facility] 05/29/2017, 12:46 PM

## 2017-05-29 NOTE — Progress Notes (Signed)
Nutrition Follow-up  DOCUMENTATION CODES:   Not applicable  INTERVENTION:   -Recommend downgrading diet to Dysphagia III for ease of chewing  -Continue Ensure Enlive po BID, each supplement provides 350 kcal and 20 grams of protein  -Add Magic cup TID with meals, each supplement provides 290 kcal and 9 grams of protein  -Encourage smaller, more frequent meals. Will order snacks   NUTRITION DIAGNOSIS:   Increased nutrient needs related to acute illness(multiple fractures) as evidenced by estimated needs.  Continues but being addressed via supplements  GOAL:   Patient will meet greater than or equal to 90% of their needs  Not met  MONITOR:   PO intake, Supplement acceptance, Diet advancement, Weight trends, Labs  REASON FOR ASSESSMENT:   Malnutrition Screening Tool    ASSESSMENT:   Patient with PMH significant for asthma, HTN, memory loss, and CKD IV. Presents this admission after a mechanical fall at home resulting in left rib fracture, pubic rami fracture, and left humeral fracture.   Pt with very poor appetite, recorded po intake 10-25% of meals. Pt reports drinking fluids well and eating cold foods like ice cream and italian ice well. Pt reports it takes her a long time to chew and swallow foods; reports fatigue when chewing and eating. Pt reports food just "stays in her mouth forever." Noted SLP is following.  Discussed downgrading diet to Dysphagia III with pt and family; pt and family in agreement with this plan.   Pt did not eat much breakfast. Family reports pt ate "well" at lunch. This consisted of 1/2 the [place], 1/3 dressing/stuffing and a bite of pie. Pt does drink the Ensure Enlive supplement well. Discussed adding Magic Cup supplement to regimen; pt and family like this plan.   No weight since 4/12. RD will request new weight  Labs: Creatinine 1.79 (improving) Meds: lasix  Diet Order:  Diet regular Room service appropriate? Yes; Fluid consistency:  Thin  EDUCATION NEEDS:   Not appropriate for education at this time  Skin:  Skin Assessment: Reviewed RN Assessment  Last BM:  4/17  Height:   Ht Readings from Last 1 Encounters:  05/23/17 '5\' 1"'  (1.549 m)    Weight:   Wt Readings from Last 1 Encounters:  05/23/17 115 lb (52.2 kg)    Ideal Body Weight:  47.7 kg  BMI:  Body mass index is 21.73 kg/m.  Estimated Nutritional Needs:   Kcal:  1400-1600 kcal  Protein:  70-80 g  Fluid:  >1.4 L/day   Kerman Passey MS, RD, LDN, CNSC [phone] Pager  [phone] Weekend/On-Call Pager

## 2017-05-29 NOTE — Progress Notes (Signed)
SLP Cancellation Note  Patient Details Name: Meagan Osborn MRN: [id number] DOB: [birth year]   Cancelled treatment:       Reason Eval/Treat Not Completed: Fatigue/lethargy limiting ability to participate   Meagan Osborn, Meagan Osborn Ann 05/29/2017, 7:33 AM  Meagan Burnetamara Nazanin Kinner, MS [facility] SLP [phone]

## 2017-05-29 NOTE — Progress Notes (Addendum)
Patient ID: Meagan Osborn, female   DOB: 1920/04/10, 84 y.o.   MRN: [id number]     Subjective:  Patient reports pain as mild.  Patient in bed and in no acute distress  Objective:   VITALS:   Vitals:   05/28/17 2147 05/29/17 0433 05/29/17 0835 05/29/17 1051  BP: (!) 187/69 (!) 164/93  (!) 145/57  Pulse: 87 (!) 127  89  Resp: 16 16    Temp: 98.6 F (37 C) 98 F (36.7 C)  98.1 F (36.7 C)  TempSrc: Oral Oral  Oral  SpO2: 96% 98% 98% 98%  Weight:      Height:        ABD soft Sensation intact distally Dorsiflexion/Plantar flexion intact Incision: scant drainage   Lab Results  Component Value Date   WBC 16.3 (H) 05/29/2017   HGB 9.7 (L) 05/29/2017   HCT 30.0 (L) 05/29/2017   MCV 93.8 05/29/2017   PLT 233 05/29/2017   BMET    Component Value Date/Time   NA 142 05/29/2017 0611   K 4.3 05/29/2017 0611   CL 110 05/29/2017 0611   CO2 23 05/29/2017 0611   GLUCOSE 113 (H) 05/29/2017 0611   BUN 49 (H) 05/29/2017 0611   CREATININE 1.79 (H) 05/29/2017 0611   CALCIUM 8.2 (L) 05/29/2017 0611   GFRNONAA 23 (L) 05/29/2017 0611   GFRAA 27 (L) 05/29/2017 [id number]     Assessment/Plan: 3 Days Post-Op   Principal Problem:   Closed fracture of left proximal humerus Active Problems:   Left rib fracture   Pubic ramus fracture, left, closed, initial encounter (HCC)   Pelvic fracture (HCC)   CKD (chronic kidney disease), stage IV (HCC)   Dysphagia   Goals of care, counseling/discussion   Palliative care by specialist   Advance diet Up with therapy Continue plan per medicine NWB left upper ext Continue sling at all times Okay for DC when clear with medicine Plan for SNF    DOUGLAS Janace LittenRRY, BRANDON 05/29/2017, 5:29 PM  Discussed and agree with above.    Teryl LucyJoshua Noha Karasik, MD Cell [phone]

## 2017-05-29 NOTE — Progress Notes (Signed)
Physical Therapy Treatment Patient Details Name: Meagan Osborn MRN: [id number] DOB: 1920-06-26 Today's Date: 05/29/2017    History of Present Illness Pt is a 84 y.o. female admitted 05/23/17 post-unwitnessed fall at home without LOC, sustaining L humerus fx, L pubic ramus fx and L 6th rib fx. Head CT shows L parietal scalp contusion without underlying skull fx; chronic appearing small vessel ischemia. Now s/p L humerus ORIF 4/15. PMH includes HTN, memory loss, CKD IV, asthma, anxiety, osteoporosis.     PT Comments    Pt is progressing slowly, requiring incr  max assist of 2 for  stand pivot transfer to chair d/t bil LEs buckling during wt shift; continue to recommend SNF; will continue to follow  Follow Up Recommendations  SNF;Supervision/Assistance - 24 hour     Equipment Recommendations  None recommended by PT(defer to SNF)    Recommendations for Other Services       Precautions / Restrictions Precautions Precautions: Fall;Shoulder Type of Shoulder Precautions: L rib fx Shoulder Interventions: Shoulder sling/immobilizer;At all times Precaution Comments: No AROM/PROM L shoulder. Ok for AROM elbow, wrist and hand to tolerance per MD Required Braces or Orthoses: Sling Restrictions Weight Bearing Restrictions: Yes LUE Weight Bearing: Non weight bearing LLE Weight Bearing: Weight bearing as tolerated    Mobility  Bed Mobility Overal bed mobility: Needs Assistance Bed Mobility: Supine to Sit     Supine to sit: Max assist     General bed mobility comments: pt to EOB with OT  Transfers Overall transfer level: Needs assistance   Transfers: Sit to/from Stand;Stand Pivot Transfers Sit to Stand: +2 physical assistance;Max assist Stand pivot transfers: +2 physical assistance;Max assist       General transfer comment: pt requiring assist to rise, steady and weight shift to chair, +LE buckling  Ambulation/Gait             General Gait Details: unable   Stairs              Wheelchair Mobility    Modified Rankin (Stroke Patients Only)       Balance Overall balance assessment: Needs assistance Sitting-balance support: Single extremity supported;Feet supported Sitting balance-Leahy Scale: Fair       Standing balance-Leahy Scale: Zero Standing balance comment: pt with  bil LE buckling                            Cognition Arousal/Alertness: Awake/alert Behavior During Therapy: WFL for tasks assessed/performed Overall Cognitive Status: History of cognitive impairments - at baseline Area of Impairment: Attention;Memory;Following commands;Safety/judgement;Awareness;Problem solving                   Current Attention Level: Sustained Memory: Decreased short-term memory;Decreased recall of precautions Following Commands: Follows one step commands with increased time;Follows multi-step commands inconsistently Safety/Judgement: Decreased awareness of safety;Decreased awareness of deficits Awareness: Emergent Problem Solving: Slow processing;Requires verbal cues General Comments: awak and alert this session, requires incr time to follow commands, multi-modal cues needed      Exercises Other Exercises Other Exercises: AROM L elbow to hand x 10 in supine    General Comments        Pertinent Vitals/Pain Pain Assessment: Faces Faces Pain Scale: Hurts even more Pain Location: L shoulder, LLE Pain Descriptors / Indicators: Sore;Guarding;Grimacing Pain Intervention(s): Limited activity within patient's tolerance;Monitored during session    Home Living  Prior Function            PT Goals (current goals can now be found in the care plan section) Acute Rehab PT Goals Patient Stated Goal: Get stronger PT Goal Formulation: With patient/family Time For Goal Achievement: 06/10/17 Potential to Achieve Goals: Good Progress towards PT goals: Progressing toward goals(slowly)     Frequency    Min 3X/week      PT Plan Current plan remains appropriate    Co-evaluation PT/OT/SLP Co-Evaluation/Treatment: Yes Reason for Co-Treatment: For patient/therapist safety          AM-PAC PT "6 Clicks" Daily Activity  Outcome Measure  Difficulty turning over in bed (including adjusting bedclothes, sheets and blankets)?: A Little Difficulty moving from lying on back to sitting on the side of the bed? : Unable Difficulty sitting down on and standing up from a chair with arms (e.g., wheelchair, bedside commode, etc,.)?: Unable Help needed moving to and from a bed to chair (including a wheelchair)?: Total Help needed walking in hospital room?: Total Help needed climbing 3-5 steps with a railing? : Total 6 Click Score: 8    End of Session Equipment Utilized During Treatment: Gait belt Activity Tolerance: Patient tolerated treatment well;Patient limited by fatigue Patient left: in chair;with call bell/phone within reach;with chair alarm set;with family/visitor present Nurse Communication: Mobility status PT Visit Diagnosis: Other abnormalities of gait and mobility (R26.89);Muscle weakness (generalized) (M62.81)     Time: [id number] PT Time Calculation (min) (ACUTE ONLY): 20 min  Charges:  $Therapeutic Activity: 8-22 mins                    G CodesDrucilla Chalet:       [name], PT Pager: [phone] 05/29/2017    [name],[name] 05/29/2017, 12:36 PM

## 2017-05-29 NOTE — Progress Notes (Signed)
Occupational Therapy Treatment Patient Details Name: Meagan Osborn MRN: [id number] DOB: 22-Mar-1920 Today's Date: 05/29/2017    History of present illness Pt is a 84 y.o. female admitted 05/23/17 post-unwitnessed fall at home without LOC, sustaining L humerus fx, L pubic ramus fx and L 6th rib fx. Head CT shows L parietal scalp contusion without underlying skull fx; chronic appearing small vessel ischemia. Now s/p L humerus ORIF 4/15. PMH includes HTN, memory loss, CKD IV, asthma, anxiety, osteoporosis.    OT comments  Pt's daughter feeding pt in supine upon arrival. Performed AROM L elbow to hand while still in bed.  Assisted pt to EOB and transferred with +2 assist to chair. Pt with difficulty weight bearing on LEs with buckling. Pt then self feeding with cough and holding food in her mouth for extended period. Pt has a ST consult pending to assess swallowing. Continues to be appropriate for SNF.   Follow Up Recommendations  SNF;Supervision/Assistance - 24 hour    Equipment Recommendations  Other (comment)(defer to next venue)    Recommendations for Other Services      Precautions / Restrictions Precautions Precautions: Fall;Shoulder Type of Shoulder Precautions: L rib fx Shoulder Interventions: Shoulder sling/immobilizer;At all times Precaution Comments: No AROM/PROM L shoulder. Ok for AROM elbow, wrist and hand to tolerance per MD Required Braces or Orthoses: Sling Restrictions Weight Bearing Restrictions: Yes LUE Weight Bearing: Non weight bearing LLE Weight Bearing: Weight bearing as tolerated       Mobility Bed Mobility Overal bed mobility: Needs Assistance Bed Mobility: Supine to Sit     Supine to sit: Max assist     General bed mobility comments: pt assisted by pushing up on her R elbow only, otherwise needed total assist  Transfers Overall transfer level: Needs assistance   Transfers: Sit to/from Stand;Stand Pivot Transfers Sit to Stand: +2 physical  assistance;Max assist Stand pivot transfers: +2 physical assistance;Max assist       General transfer comment: pt requiring assist to rise, steady and weight shift to chair, +LE buckling    Balance Overall balance assessment: Needs assistance   Sitting balance-Leahy Scale: Fair       Standing balance-Leahy Scale: Zero Standing balance comment: pt with LE buckling                           ADL either performed or assessed with clinical judgement   ADL Overall ADL's : Needs assistance/impaired Eating/Feeding: Supervision/ safety;Set up;Sitting Eating/Feeding Details (indicate cue type and reason): ST consult in for difficulty swallowing             Upper Body Dressing : Total assistance;Sitting   Lower Body Dressing: Total assistance;Bed level   Toilet Transfer: +2 for safety/equipment;Maximal assistance;Stand-pivot   Toileting- Clothing Manipulation and Hygiene: Total assistance         General ADL Comments: adjusted sling     Vision       Perception     Praxis      Cognition Arousal/Alertness: Awake/alert Behavior During Therapy: WFL for tasks assessed/performed Overall Cognitive Status: History of cognitive impairments - at baseline Area of Impairment: Attention;Memory;Following commands;Safety/judgement;Awareness;Problem solving                   Current Attention Level: Sustained Memory: Decreased short-term memory;Decreased recall of precautions Following Commands: Follows one step commands with increased time;Follows multi-step commands inconsistently Safety/Judgement: Decreased awareness of safety;Decreased awareness of deficits Awareness: Emergent Problem Solving: Slow  processing;Requires verbal cues          Exercises Exercises: Other exercises Other Exercises Other Exercises: AROM L elbow to hand x 10 in supine   Shoulder Instructions       General Comments      Pertinent Vitals/ Pain       Pain Assessment:  No/denies pain  Home Living                                          Prior Functioning/Environment              Frequency  Min 2X/week        Progress Toward Goals  OT Goals(current goals can now be found in the care plan section)  Progress towards OT goals: Progressing toward goals  Acute Rehab OT Goals Patient Stated Goal: Get stronger OT Goal Formulation: With patient/family Time For Goal Achievement: 06/10/17 Potential to Achieve Goals: Good  Plan Discharge plan remains appropriate    Co-evaluation    PT/OT/SLP Co-Evaluation/Treatment: Yes Reason for Co-Treatment: For patient/therapist safety          AM-PAC PT "6 Clicks" Daily Activity     Outcome Measure   Help from another person eating meals?: A Little Help from another person taking care of personal grooming?: A Lot Help from another person toileting, which includes using toliet, bedpan, or urinal?: Total Help from another person bathing (including washing, rinsing, drying)?: Total Help from another person to put on and taking off regular upper body clothing?: Total Help from another person to put on and taking off regular lower body clothing?: Total 6 Click Score: 9    End of Session Equipment Utilized During Treatment: Oxygen  OT Visit Diagnosis: Unsteadiness on feet (R26.81);Other abnormalities of gait and mobility (R26.89);Muscle weakness (generalized) (M62.81);History of falling (Z91.81);Pain   Activity Tolerance Patient tolerated treatment well   Patient Left in chair;with call bell/phone within reach;with chair alarm set;with family/visitor present   Nurse Communication          Time: [id number] OT Time Calculation (min): 27 min  Charges: OT General Charges $OT Visit: 1 Visit OT Treatments $Self Care/Home Management : 8-22 mins  05/29/2017 Martie RoundJulie Caliope Ruppert, OTR/L Pager: [phone]   Iran PlanasMayberry, [place] BailiffJulie Lynn 05/29/2017, 10:37 AM

## 2017-05-29 NOTE — Progress Notes (Signed)
PROGRESS NOTE    Meagan Osborn  JYN:[id number]:[id number] DOB: [birth year] DOA: 05/23/2017 PCP: Barbie BannerWilson, Fred H, MD     Brief Narrative:  Meagan Osborn a 84 y.o.femalewith medical history significantforasthma, CKD stage 4,progressive memory loss, who presented to the ED after patient sustained a fall at home.Patient is normally able to make her own breakfast and get around her daughter's house with no assistance.  She has on-going issues since September with swallowing/pocketing food.     Assessment & Plan:   Principal Problem:   Closed fracture of left proximal humerus Active Problems:   Left rib fracture   Pubic ramus fracture, left, closed, initial encounter (HCC)   Pelvic fracture (HCC)   CKD (chronic kidney disease), stage IV (HCC)   Dysphagia   Goals of care, counseling/discussion   Palliative care by specialist   Left proximal humerus fracture, left 6th rib fracture, pubic rami fracture s/p mechanical fall without loss of consciousness -Orthopedic surgery consulted, s/p OR 4/15 -Will need significant rehab post-discharge -s/p 2u pRBC with blood transfusion Reaction of elevated BP-- having to check BP on legs so not sure how accurate -echo: - Normal LV size with mild LV hypertrophy. EF 55-60%. Moderate   diastolic dysfunction. Normal RV size and systolic function.   Aortic valve sclerosis without significant stenosis. Calcified   mitral valve and annulus. There was mild mitral regurgitation and   probably mild mitral stenosis. Moderate pulmonary hypertension. -plan for scheduled tylenol suppository for now until able to take PO -SNF placement once eating better-- patient worked well with PT and would be a great candidate for rehabilitation once breathing improves and eating more  CKD stage 4 -Baseline Cr in 2015-2017 has been 1.5-1.9 -Continue to monitor urine output -doubt would be candidate for HD  Acute resp failure due to pleural effusions -needs 2L O2 -x ray  shows layering pulm effusions/edema-- responded well with IV lasix -will give second dose of IV lasix today and then remove foley  HTN -IV scheduled and PO if able to take  Hypothyroidism -Continue synthroid  Anxiety -Continue zoloft  Memory loss -Progressive memory loss at home, although quite functional and independent. No formal dx of dementia although family notes mentation decline over the past several months  -B12 low-- replace IM  Dysphagia ->6 month history -has had outpatient SLP eval and barium swallow -appears to be more related to memory issues-- pocketing food -palliative care discussion with daughter and consult appreciated     DVT prophylaxis: SCD  Code Status: DNR Family Communication: Daughter/HCPOA at bedside Disposition Plan: SNF when eating more hopefully in 48 hours   Consultants:   Orthopedic surgery  Palliative care    Antimicrobials:  Anti-infectives (From admission, onward)   Start     Dose/Rate Route Frequency Ordered Stop   05/26/17 2230  ceFAZolin (ANCEF) IVPB 1 g/50 mL premix     1 g 100 mL/hr over 30 Minutes Intravenous Every 6 hours 05/26/17 2036 05/27/17 1154   05/26/17 1527  ceFAZolin (ANCEF) 2-4 GM/100ML-% IVPB    Note to Pharmacy:  Sabino NiemannHogue, Samantha   : cabinet override      05/26/17 1527 05/27/17 0344   05/25/17 0600  ceFAZolin (ANCEF) IVPB 2g/100 mL premix  Status:  Discontinued     2 g 200 mL/hr over 30 Minutes Intravenous On call to O.R. 05/24/17 2044 05/25/17 1319       Subjective: Patient says she is breathing better today-- daughter at bedside feeding her  Objective:  Vitals:   05/28/17 2147 05/29/17 0433 05/29/17 0835 05/29/17 1051  BP: (!) 187/69 (!) 164/93  (!) 145/57  Pulse: 87 (!) 127  89  Resp: 16 16    Temp: 98.6 F (37 C) 98 F (36.7 C)  98.1 F (36.7 C)  TempSrc: Oral Oral  Oral  SpO2: 96% 98% 98% 98%  Weight:      Height:        Intake/Output Summary (Last 24 hours) at 05/29/2017 1329 Last data  filed at 05/29/2017 1009 Gross per 24 hour  Intake 480 ml  Output 2100 ml  Net -1620 ml   Filed Weights   05/23/17 2145  Weight: 52.2 kg (115 lb)    Examination:  General exam: sitting in bed, eating-- no current swallowing issues Respiratory system: diminished at bases, no wheezing Cardiovascular system: rrr, +murmur Gastrointestinal system: soft, NT Central nervous system: much more interactive     Data Reviewed: I have personally reviewed following labs and imaging studies  CBC: Recent Labs  Lab 05/23/17 2140  05/25/17 0436 05/26/17 0235 05/27/17 0608 05/28/17 0652 05/29/17 0611  WBC 14.4*   < > 10.7* 15.5* 15.9* 14.1* 16.3*  NEUTROABS 11.6*  --   --   --   --   --   --   HGB 9.9*   < > 7.2* 10.3* 9.7* 9.3* 9.7*  HCT 30.6*   < > 22.6* 31.8* 30.6* 28.7* 30.0*  MCV 96.2   < > 98.7 93.5 93.0 93.5 93.8  PLT 224   < > 164 198 212 201 233   < > = values in this interval not displayed.   Basic Metabolic Panel: Recent Labs  Lab 05/25/17 0436 05/26/17 0235 05/27/17 0608 05/28/17 0652 05/29/17 0611  NA 140 141 143 138 142  K 4.0 4.0 4.1 4.7 4.3  CL 109 108 110 110 110  CO2 22 22 22  21* 23  GLUCOSE 98 128* 111* 122* 113*  BUN 28* 29* 40* 44* 49*  CREATININE 1.77* 1.88* 1.94* 1.81* 1.79*  CALCIUM 7.2* 8.0* 7.9* 7.9* 8.2*   GFR: Estimated Creatinine Clearance: 14.5 mL/min (A) (by C-G formula based on SCr of 1.79 mg/dL (H)). Liver Function Tests: Recent Labs  Lab 05/23/17 2140  AST 19  ALT 18  ALKPHOS 52  BILITOT 0.6  PROT 6.3*  ALBUMIN 3.3*   No results for input(s): LIPASE, AMYLASE in the last 168 hours. No results for input(s): AMMONIA in the last 168 hours. Coagulation Profile: No results for input(s): INR, PROTIME in the last 168 hours. Cardiac Enzymes: Recent Labs  Lab 05/23/17 2140 05/24/17 0300 05/24/17 0930  TROPONINI 0.05* 0.05* 0.04*   BNP (last 3 results) No results for input(s): PROBNP in the last 8760 hours. HbA1C: No results for  input(s): HGBA1C in the last 72 hours. CBG: Recent Labs  Lab 05/25/17 1518  GLUCAP 117*   Lipid Profile: No results for input(s): CHOL, HDL, LDLCALC, TRIG, CHOLHDL, LDLDIRECT in the last 72 hours. Thyroid Function Tests: No results for input(s): TSH, T4TOTAL, FREET4, T3FREE, THYROIDAB in the last 72 hours. Anemia Panel: No results for input(s): VITAMINB12, FOLATE, FERRITIN, TIBC, IRON, RETICCTPCT in the last 72 hours. Sepsis Labs: No results for input(s): PROCALCITON, LATICACIDVEN in the last 168 hours.  Recent Results (from the past 240 hour(s))  Surgical pcr screen     Status: None   Collection Time: 05/24/17 10:26 PM  Result Value Ref Range Status   MRSA, PCR NEGATIVE NEGATIVE Final  Staphylococcus aureus NEGATIVE NEGATIVE Final    Comment: (NOTE) The Xpert SA Assay (FDA approved for NASAL specimens in patients 72 years of age and older), is one component of a comprehensive surveillance program. It is not intended to diagnose infection nor to guide or monitor treatment. Performed at [facility], [address], [place], [place] [address]        Radiology Studies: Dg Chest Port 1 View  Result Date: 05/28/2017 CLINICAL DATA:  Dyspnea EXAM: PORTABLE CHEST 1 VIEW COMPARISON:  Five days ago FINDINGS: There is chronic cardiomegaly. Haziness of the lower chest from layering effusions and presumed atelectasis. Interstitial coarsening that is likely edema. Known left-sided rib fracture. IMPRESSION: Bilateral layering pleural effusion and atelectasis has developed since study 5 days prior. Vascular congestion. Electronically Signed   By: Marnee Spring M.D.   On: 05/28/2017 13:47      Scheduled Meds: . acetaminophen  650 mg Rectal Q6H  . enoxaparin (LOVENOX) injection  30 mg Subcutaneous Q24H  . feeding supplement (ENSURE ENLIVE)  237 mL Oral BID BM  . levothyroxine  25 mcg Intravenous Daily  . metoprolol tartrate  25 mg Oral BID  . mometasone-formoterol   2 puff Inhalation BID  . sertraline  25 mg Oral Daily   Continuous Infusions:    LOS: 5 days    Time spent: 35 minutes   Joseph Art, DO [facility] [email or web address] Password [email or web address] 05/29/2017, 1:29 PM

## 2017-05-30 ENCOUNTER — Inpatient Hospital Stay (HOSPITAL_COMMUNITY): Payer: Medicare Other

## 2017-05-30 DIAGNOSIS — W19XXXS Unspecified fall, sequela: Secondary | ICD-10-CM

## 2017-05-30 MED ORDER — LEVOTHYROXINE SODIUM 50 MCG PO TABS
50.0000 ug | ORAL_TABLET | Freq: Every day | ORAL | Status: DC
  Administered 2017-05-31 – 2017-06-03 (×4): 50 ug via ORAL
  Filled 2017-05-30 (×4): qty 1

## 2017-05-30 MED ORDER — FUROSEMIDE 10 MG/ML IJ SOLN
20.0000 mg | Freq: Once | INTRAMUSCULAR | Status: AC
  Administered 2017-05-30: 20 mg via INTRAVENOUS
  Filled 2017-05-30: qty 2

## 2017-05-30 MED ORDER — ACETAMINOPHEN 160 MG/5ML PO SOLN
650.0000 mg | Freq: Four times a day (QID) | ORAL | Status: DC
  Administered 2017-05-30 – 2017-06-03 (×10): 650 mg via ORAL
  Filled 2017-05-30 (×13): qty 20.3

## 2017-05-30 NOTE — Progress Notes (Signed)
Foley removed, patient was able to void however bladder scan showed appox. 500 mLs, MD was paged, verbal orders to wait, recheck bladder scan and insert foley if acute urinary retention continues. Night nurse aware.

## 2017-05-30 NOTE — Progress Notes (Signed)
PROGRESS NOTE    Meagan Osborn  WUJ:[id number] DOB: 1920/06/11 DOA: 05/23/2017 PCP: Barbie Banner, MD     Brief Narrative:  Meagan Osborn a 84 y.o.femalewith medical history significantforasthma, CKD stage 4,progressive memory loss, who presented to the ED after patient sustained a fall at home.Patient is normally able to make her own breakfast and get around her daughter's house with no assistance.  She has on-going issues since September with swallowing/pocketing food.  S/p operative repair.  Developed volume overload from IVF and is slow to recover.  But over last 24 hours has taken in more PO intake w/o vomiting   Assessment & Plan:   Principal Problem:   Closed fracture of left proximal humerus Active Problems:   Left rib fracture   Pubic ramus fracture, left, closed, initial encounter (HCC)   Pelvic fracture (HCC)   CKD (chronic kidney disease), stage IV (HCC)   Dysphagia   Goals of care, counseling/discussion   Palliative care by specialist   Left proximal humerus fracture, left 6th rib fracture, pubic rami fracture s/p mechanical fall without loss of consciousness -Orthopedic surgery consulted, s/p OR 4/15 -Will need significant rehab post-discharge -s/p 2u pRBC with blood transfusion Reaction of elevated BP-- having to check BP on legs so not sure how accurate -echo: - Normal LV size with mild LV hypertrophy. EF 55-60%. Moderate   diastolic dysfunction. Normal RV size and systolic function.   Aortic valve sclerosis without significant stenosis. Calcified   mitral valve and annulus. There was mild mitral regurgitation and   probably mild mitral stenosis. Moderate pulmonary hypertension. -plan for scheduled tylenol suppository for now until able to take PO -SNF placement once eating better and is diuresed----- making good progress over the last 24 hours with intake and toleration of meds via the oral route  CKD stage 4 -Baseline Cr in 2015-2017 has been  1.5-1.9 -Continue to monitor urine output-- and CR while diuresing -doubt would be candidate for HD  Acute resp failure due to pleural effusions -needs 2L O2-- wean as able -x ray shows layering pulm effusions/edema-- responded well with IV lasix-- today will be 3rd dose and if I/Os are correct, finally in the negative range-- have been dosing daily  Leukocytosis -monitor, no fever -hold on abx  HTN -does well with liquid meds  Hypothyroidism -Continue synthroid PO  Anxiety -Continue zoloft  Memory loss -Progressive memory loss at home, although quite functional and independent. No formal dx of dementia although family notes mentation decline over the past several months  -B12 low-- replace IM-- will need outpatient follow up  Dysphagia ->6 month history -has had outpatient SLP eval and barium swallow -appears to be more related to memory issues-- pocketing food -palliative care discussion with daughter and consult appreciated -per nutrition, will try dys 3 diet for ease of chewing    DVT prophylaxis: SCD  Code Status: DNR Family Communication: Daughter/HCPOA at bedside 4/18-- was not present 4/19 Disposition Plan: SNF when eating more hopefully in 48 hours   Consultants:   Orthopedic surgery  Palliative care    Antimicrobials:  Anti-infectives (From admission, onward)   Start     Dose/Rate Route Frequency Ordered Stop   05/26/17 2230  ceFAZolin (ANCEF) IVPB 1 g/50 mL premix     1 g 100 mL/hr over 30 Minutes Intravenous Every 6 hours 05/26/17 2036 05/27/17 1154   05/26/17 1527  ceFAZolin (ANCEF) 2-4 GM/100ML-% IVPB    Note to Pharmacy:  Meagan Osborn   :  cabinet override      05/26/17 1527 05/27/17 0344   05/25/17 0600  ceFAZolin (ANCEF) IVPB 2g/100 mL premix  Status:  Discontinued     2 g 200 mL/hr over 30 Minutes Intravenous On call to O.R. 05/24/17 2044 05/25/17 1319       Subjective: Nursing reports patient is tolerating meds and eating more  today  Objective: Vitals:   05/30/17 0224 05/30/17 0441 05/30/17 0810 05/30/17 1105  BP: (!) 159/60 (!) 148/61 (!) 166/81   Pulse: 87 82 81   Resp: 18 16 16    Temp:  98 F (36.7 C) 98 F (36.7 C)   TempSrc:   Oral   SpO2: 98% 98% 97% 98%  Weight:      Height:        Intake/Output Summary (Last 24 hours) at 05/30/2017 1425 Last data filed at 05/30/2017 1350 Gross per 24 hour  Intake 480 ml  Output 2250 ml  Net -1770 ml   Filed Weights   05/23/17 2145  Weight: 52.2 kg (115 lb)    Examination:  General exam: sleepy today, but says breathing better Respiratory system: diminished at base, no wheezing Cardiovascular system: rrr-- +murmur Gastrointestinal system: soft, +BS Central nervous system: will awaken and interact, appropriate     Data Reviewed: I have personally reviewed following labs and imaging studies  CBC: Recent Labs  Lab 05/23/17 2140  05/25/17 0436 05/26/17 0235 05/27/17 0608 05/28/17 0652 05/29/17 0611  WBC 14.4*   < > 10.7* 15.5* 15.9* 14.1* 16.3*  NEUTROABS 11.6*  --   --   --   --   --   --   HGB 9.9*   < > 7.2* 10.3* 9.7* 9.3* 9.7*  HCT 30.6*   < > 22.6* 31.8* 30.6* 28.7* 30.0*  MCV 96.2   < > 98.7 93.5 93.0 93.5 93.8  PLT 224   < > 164 198 212 201 233   < > = values in this interval not displayed.   Basic Metabolic Panel: Recent Labs  Lab 05/25/17 0436 05/26/17 0235 05/27/17 0608 05/28/17 0652 05/29/17 0611  NA 140 141 143 138 142  K 4.0 4.0 4.1 4.7 4.3  CL 109 108 110 110 110  CO2 22 22 22  21* 23  GLUCOSE 98 128* 111* 122* 113*  BUN 28* 29* 40* 44* 49*  CREATININE 1.77* 1.88* 1.94* 1.81* 1.79*  CALCIUM 7.2* 8.0* 7.9* 7.9* 8.2*   GFR: Estimated Creatinine Clearance: 14.5 mL/min (A) (by C-G formula based on SCr of 1.79 mg/dL (H)). Liver Function Tests: Recent Labs  Lab 05/23/17 2140  AST 19  ALT 18  ALKPHOS 52  BILITOT 0.6  PROT 6.3*  ALBUMIN 3.3*   No results for input(s): LIPASE, AMYLASE in the last 168 hours. No  results for input(s): AMMONIA in the last 168 hours. Coagulation Profile: No results for input(s): INR, PROTIME in the last 168 hours. Cardiac Enzymes: Recent Labs  Lab 05/23/17 2140 05/24/17 0300 05/24/17 0930  TROPONINI 0.05* 0.05* 0.04*   BNP (last 3 results) No results for input(s): PROBNP in the last 8760 hours. HbA1C: No results for input(s): HGBA1C in the last 72 hours. CBG: Recent Labs  Lab 05/25/17 1518  GLUCAP 117*   Lipid Profile: No results for input(s): CHOL, HDL, LDLCALC, TRIG, CHOLHDL, LDLDIRECT in the last 72 hours. Thyroid Function Tests: No results for input(s): TSH, T4TOTAL, FREET4, T3FREE, THYROIDAB in the last 72 hours. Anemia Panel: No results for input(s): VITAMINB12, FOLATE, FERRITIN,  TIBC, IRON, RETICCTPCT in the last 72 hours. Sepsis Labs: No results for input(s): PROCALCITON, LATICACIDVEN in the last 168 hours.  Recent Results (from the past 240 hour(s))  Surgical pcr screen     Status: None   Collection Time: 05/24/17 10:26 PM  Result Value Ref Range Status   MRSA, PCR NEGATIVE NEGATIVE Final   Staphylococcus aureus NEGATIVE NEGATIVE Final    Comment: (NOTE) The Xpert SA Assay (FDA approved for NASAL specimens in patients 41 years of age and older), is one component of a comprehensive surveillance program. It is not intended to diagnose infection nor to guide or monitor treatment. Performed at [facility], [address], [place], [place] [address]        Radiology Studies: Dg Chest Port 1 View  Result Date: 05/30/2017 CLINICAL DATA:  Fluid excess EXAM: PORTABLE CHEST 1 VIEW COMPARISON:  05/28/2017 FINDINGS: There are moderate bilateral pleural effusions and bibasilar opacities. Heart size is enlarged but also obscured by bibasilar opacities which appear stable. There is mild vascular prominence. IMPRESSION: Persistent pulmonary changes consistent with pulmonary edema. Electronically Signed   By: Norva Pavlov M.D.    On: 05/30/2017 09:23      Scheduled Meds: . acetaminophen (TYLENOL) oral liquid 160 mg/5 mL  650 mg Oral Q6H  . enoxaparin (LOVENOX) injection  30 mg Subcutaneous Q24H  . feeding supplement (ENSURE ENLIVE)  237 mL Oral BID BM  . [START ON 05/31/2017] levothyroxine  50 mcg Oral QAC breakfast  . metoprolol tartrate  25 mg Oral BID  . mometasone-formoterol  2 puff Inhalation BID  . sertraline  25 mg Oral Daily   Continuous Infusions:    LOS: 6 days    Time spent: 35 minutes   Joseph Art, DO [facility] [email or web address] Password [email or web address] 05/30/2017, 2:25 PM

## 2017-05-30 NOTE — Progress Notes (Signed)
SPORTS MEDICINE AND JOINT REPLACEMENT  Georgena Spurling, MD    Laurier Nancy, PA-C [address] [place], [place], [place]  [address]                             [phone]   PROGRESS NOTE  Subjective:  negative for Chest Pain  negative for Shortness of Breath  negative for Nausea/Vomiting   negative for Calf Pain  negative for Bowel Movement   Tolerating Diet: yes         Patient reports pain as 3 on 0-10 scale.    Objective: Vital signs in last 24 hours:    Patient Vitals for the past 24 hrs:  BP Temp Temp src Pulse Resp SpO2  05/30/17 0441 (!) 148/61 98 F (36.7 C) - 82 16 98 %  05/30/17 0224 (!) 159/60 - - 87 18 98 %  05/30/17 0020 (!) 175/61 - - 95 16 98 %  05/29/17 2104 (!) 230/84 98.2 F (36.8 C) Oral 89 18 99 %  05/29/17 1951 - - - - - 98 %  05/29/17 1750 (!) 160/65 98.2 F (36.8 C) Oral 90 18 99 %  05/29/17 1051 (!) 145/57 98.1 F (36.7 C) Oral 89 - 98 %  05/29/17 0835 - - - - - 98 %    @flow {1959:LAST@   Intake/Output from previous day:   04/18 0701 - 04/19 0700 In: 480 [P.O.:480] Out: 2850 [Urine:2850]   Intake/Output this shift:   No intake/output data recorded.   Intake/Output      04/18 0701 - 04/19 0700 04/19 0701 - 04/20 0700   P.O. 480    Total Intake(mL/kg) 480 (9.2)    Urine (mL/kg/hr) 2850 (2.3)    Emesis/NG output     Stool 0    Total Output 2850    Net -2370         Urine Occurrence 0 x    Stool Occurrence 1 x       LABORATORY DATA: Recent Labs    05/23/17 2140 05/24/17 0545 05/25/17 0436 05/26/17 0235 05/27/17 0608 05/28/17 0652 05/29/17 0611  WBC 14.4* 14.0* 10.7* 15.5* 15.9* 14.1* 16.3*  HGB 9.9* 8.9* 7.2* 10.3* 9.7* 9.3* 9.7*  HCT 30.6* 27.0* 22.6* 31.8* 30.6* 28.7* 30.0*  PLT 224 199 164 198 212 201 233   Recent Labs    05/23/17 2140 05/24/17 0544 05/25/17 0436 05/26/17 0235 05/27/17 0608 05/28/17 0652 05/29/17 0611  NA 138 138 140 141 143 138 142  K 4.1 4.3 4.0 4.0 4.1 4.7 4.3  CL 102 104 109 108 110 110  110  CO2 25 26 22 22 22  21* 23  BUN 23* 25* 28* 29* 40* 44* 49*  CREATININE 1.58* 1.48* 1.77* 1.88* 1.94* 1.81* 1.79*  GLUCOSE 129* 122* 98 128* 111* 122* 113*  CALCIUM 8.4* 8.0* 7.2* 8.0* 7.9* 7.9* 8.2*   No results found for: INR, PROTIME  Examination:  General appearance: alert, cooperative and no distress Extremities: extremities normal, atraumatic, no cyanosis or edema  Wound Exam: clean, dry, intact   Drainage:  None: wound tissue dry  Motor Exam: Opposition, Pinch and Wrist Dorsiflexion Intact  Sensory Exam: Radial, Ulnar and Median normal   Assessment:    4 Days Post-Op  Procedure(s) (LRB): OPEN REDUCTION INTERNAL FIXATION (ORIF) PROXIMAL HUMERUS FRACTURE (Left)  ADDITIONAL DIAGNOSIS:  Principal Problem:   Closed fracture of left proximal humerus Active Problems:   Left rib fracture  Pubic ramus fracture, left, closed, initial encounter (HCC)   Pelvic fracture (HCC)   CKD (chronic kidney disease), stage IV (HCC)   Dysphagia   Goals of care, counseling/discussion   Palliative care by specialist     Plan: Physical Therapy as ordered Non Weight Bearing (NWB) left upper extremity  DVT Prophylaxis:  Lovenox  DISCHARGE PLAN: Skilled Nursing Facility/Rehab  Patient doing well. Continue plan per medicine. Ok to D/C to SNF when medically cleared         Guy SandiferColby Alan Dashanae Longfield 05/30/2017, 7:29 AM

## 2017-05-30 NOTE — Clinical Social Work Note (Addendum)
Talked with daughter, Alma DownsSusan Wingo by phone [phone]) and [facility]. Call made to [facility], [facility] director [facility] regarding patient. She was aware of family's interest as they visited facility today. Dannielle KarvonenKanesha advised probable d/c timeframe per MD's progress note for today. Dannielle KarvonenKanesha indicated that she will initiate insurance authorization.   PASRR number received:  [id number] A, eff. 05/29/17.  CSW will continue to follow and facilitate [facility] when medically stable.  Genelle BalVanessa Carli Lefevers, MSW, LCSW Licensed Clinical Social Worker Clinical Social Work Department [facility] [phone]

## 2017-05-30 NOTE — Progress Notes (Signed)
  Speech Language Pathology Treatment: Dysphagia  Patient Details Name: Meagan Osborn MRN: [id number] DOB: 24-Jun-1920 Today's Date: 05/30/2017 Time: 7741-2878 SLP Time Calculation (min) (ACUTE ONLY): 14 min  Assessment / Plan / Recommendation Clinical Impression  SLP arrived at pt eating breakfast. Subtle throat clears throughout not concerning for significant airway intrusion. Prolonged mastication, bolus formation and transit with eggs (can be difficult texture to control). Spontaneously used tongue which did not completely clear however oral cavity clean after magic cup. Daughter not present but previously documented she wants to continue regular texture. Recommend staff/family check oral cavity after meals- toothettes left at bedside. ST will sign off.    HPI HPI: Meagan Osborn is a 84 y.o. female with medical history significant for asthma, CKD4, who presented to the ED after patient sustained a fall at home. Suffered Left proximal humerus fracture, s/p L humerus ORIF 4/15,  left 6th rib fracture, pubic rami fracture. Per chart patient lives with her daughter. Recent congestion and some cough and poor p.o., oral pocketing and holding. Per MD note daughter reports some short term memory problems, worse over the past few months but has not been formally diagnosed with dementia. MBS 02/2017 oral dysphagia, holding, piecemealing, prominent cricopharyngeus and reg/thin recommended.       SLP Plan  All goals met;Discharge SLP treatment due to (comment)       Recommendations  Diet recommendations: Regular;Thin liquid Liquids provided via: Cup;Straw Medication Administration: Crushed with puree Supervision: Patient able to self feed;Staff to assist with self feeding Compensations: Minimize environmental distractions Postural Changes and/or Swallow Maneuvers: Seated upright 90 degrees                Oral Care Recommendations: Oral care BID Follow up Recommendations: None SLP Visit  Diagnosis: Dysphagia, oral phase (R13.11) Plan: All goals met;Discharge SLP treatment due to (comment)       GO                Yoselyn, [place]ade 05/30/2017, 9:40 AM  Orbie Pyo Colvin Caroli.Ed [employer] [phone]

## 2017-05-31 DIAGNOSIS — S42202A Unspecified fracture of upper end of left humerus, initial encounter for closed fracture: Principal | ICD-10-CM

## 2017-05-31 DIAGNOSIS — E8770 Fluid overload, unspecified: Secondary | ICD-10-CM

## 2017-05-31 DIAGNOSIS — S2232XA Fracture of one rib, left side, initial encounter for closed fracture: Secondary | ICD-10-CM

## 2017-05-31 DIAGNOSIS — N184 Chronic kidney disease, stage 4 (severe): Secondary | ICD-10-CM

## 2017-05-31 DIAGNOSIS — J9601 Acute respiratory failure with hypoxia: Secondary | ICD-10-CM

## 2017-05-31 LAB — CBC
HEMATOCRIT: 30.9 % — AB (ref 36.0–46.0)
HEMOGLOBIN: 9.7 g/dL — AB (ref 12.0–15.0)
MCH: 29.7 pg (ref 26.0–34.0)
MCHC: 31.4 g/dL (ref 30.0–36.0)
MCV: 94.5 fL (ref 78.0–100.0)
Platelets: 275 10*3/uL (ref 150–400)
RBC: 3.27 MIL/uL — AB (ref 3.87–5.11)
RDW: 15.4 % (ref 11.5–15.5)
WBC: 15.5 10*3/uL — ABNORMAL HIGH (ref 4.0–10.5)

## 2017-05-31 LAB — BASIC METABOLIC PANEL
Anion gap: 10 (ref 5–15)
BUN: 46 mg/dL — AB (ref 6–20)
CHLORIDE: 103 mmol/L (ref 101–111)
CO2: 29 mmol/L (ref 22–32)
Calcium: 8.1 mg/dL — ABNORMAL LOW (ref 8.9–10.3)
Creatinine, Ser: 1.57 mg/dL — ABNORMAL HIGH (ref 0.44–1.00)
GFR calc Af Amer: 31 mL/min — ABNORMAL LOW (ref >60)
GFR calc non Af Amer: 27 mL/min — ABNORMAL LOW (ref >60)
Glucose, Bld: 89 mg/dL (ref 65–99)
Potassium: 3.5 mmol/L (ref 3.5–5.1)
SODIUM: 142 mmol/L (ref 135–145)

## 2017-05-31 MED ORDER — METOPROLOL TARTRATE 25 MG PO TABS
25.0000 mg | ORAL_TABLET | Freq: Two times a day (BID) | ORAL | Status: DC
  Administered 2017-05-31 – 2017-06-03 (×6): 25 mg via ORAL
  Filled 2017-05-31 (×6): qty 1

## 2017-05-31 MED ORDER — SENNA 8.6 MG PO TABS
1.0000 | ORAL_TABLET | Freq: Every day | ORAL | Status: DC
  Administered 2017-05-31 – 2017-06-01 (×2): 8.6 mg via ORAL
  Filled 2017-05-31 (×3): qty 1

## 2017-05-31 MED ORDER — POLYETHYLENE GLYCOL 3350 17 G PO PACK
17.0000 g | PACK | Freq: Two times a day (BID) | ORAL | Status: DC
  Administered 2017-05-31 – 2017-06-01 (×4): 17 g via ORAL
  Filled 2017-05-31 (×6): qty 1

## 2017-05-31 MED ORDER — METOPROLOL TARTRATE 25 MG/10 ML ORAL SUSPENSION: 50.0000 mg | Freq: Two times a day (BID) | ORAL | Status: DC

## 2017-05-31 MED ORDER — AMLODIPINE BESYLATE 5 MG PO TABS
5.0000 mg | ORAL_TABLET | Freq: Every day | ORAL | Status: DC
  Administered 2017-05-31 – 2017-06-01 (×2): 5 mg via ORAL
  Filled 2017-05-31 (×2): qty 1

## 2017-05-31 MED ORDER — FUROSEMIDE 10 MG/ML IJ SOLN
20.0000 mg | Freq: Once | INTRAMUSCULAR | Status: AC
  Administered 2017-05-31: 20 mg via INTRAVENOUS
  Filled 2017-05-31: qty 2

## 2017-05-31 NOTE — Progress Notes (Addendum)
PROGRESS NOTE  Meagan BostonDelia P Osborn ZOX:[id number] DOB: 05/11/1920 DOA: 05/23/2017 PCP: Barbie BannerWilson, Fred H, MD  Brief Narrative: 84 year old woman PMH asthma, chronic kidney disease stage IV, dementia presented to the emergency department after a fall at home, unwitnessed.  Per family has a history of chronic orthostatic dizziness.  Workup revealed multiple fractures including superior and inferior pubic rami, left rib fracture, left humeral fracture.  She was admitted for further evaluation by orthopedics.  [facility] and underwent operative fixation of her left humeral fracture 4/15.  Subsequently developed volume overload from IV fluids and has been slow to recover.  Assessment/Plan Acute hypoxic respiratory failure secondary to pulmonary edema, secondary to volume overload, complicated by chronic diastolic dysfunction/CHF, possibly also by moderate pulmonary hypertension.  Telemetry has been sinus rhythm.  Discontinue. --I/O matched but I suspect she is remains volume overloaded based on chest x-ray and ongoing hypoxia. --IV diuresis today --Strict I/O --Wean to room air  Closed fracture left proximal humerus status post fall at home, likely orthostatic in nature.  Status post operative repair 4/15.  --Nonweightbearing left upper extremity. Continue sling at all times --Follow-up with Dr. Dion SaucierLandau first week in May.  Pubic ramus fracture, left, closed --Weight-bear as tolerated per orthopedics  Acute displaced left sixth rib fracture. --Supportive treatment per orthopedics  Dysphagia -- Diet recommendations: Regular;Thin liquid (however using dysphagia 3 diet for ease of chewing) Liquids provided via: Cup;Straw Medication Administration: Crushed with puree Supervision: Patient able to self feed;Staff to assist with self feeding Compensations: Minimize environmental distractions Postural Changes and/or Swallow Maneuvers: Seated upright 90 degrees  Acute blood loss anemia  superimposed on chronic anemia, type unknown, baseline approximately 10.2-10.5 per care everywhere.. --Stable status post transfusion.  Appears to be at baseline.  Leukocytosis.  Persistent.  This is a new finding this hospitalization. --Likely related to fractures.  Follow clinically.  Asthma --Appears stable.  Hypothyroidism.  TSH within normal limits. --Continue levothyroxine.  Chronic kidney disease stage IV.  Baseline approximately 1.5-1.9 in care everywhere. --At baseline.  Essential hypertension, poorly with baseline in the 200s --Poorly controlled through most of hospitalization.  Will increase beta-blocker and restart amlodipine.  Dementia --Follow-up as an outpatient.  DVT prophylaxis: Enoxaparin Code Status: DNR Family Communication: Daughter at bedside Disposition Plan: SNF. [facility]  Brendia Sacksaniel Bunyan Brier, MD  [facility] Direct contact: [phone] --Via amion app OR  --[email or web address]; password [email or web address]  7PM-7AM contact night coverage as above 05/31/2017, 2:45 PM  LOS: 7 days   Consultants:  Orthopedics  PMT  Procedures:  OPEN REDUCTION INTERNAL FIXATION (ORIF) PROXIMAL HUMERUS FRACTURE  Echo Impressions:  - Normal LV size with mild LV hypertrophy. EF 55-60%. Moderate   diastolic dysfunction. Normal RV size and systolic function.   Aortic valve sclerosis without significant stenosis. Calcified   mitral valve and annulus. There was mild mitral regurgitation and   probably mild mitral stenosis. Moderate pulmonary hypertension.  Antimicrobials:    Interval history/Subjective: Feels better, breathing okay, pain is mostly in left arm. Poor appetite not eating very well.  Objective: Vitals:  Vitals:   05/31/17 0445 05/31/17 0810  BP: (!) 206/84 (!) 191/80  Pulse: 69 78  Resp:  18  Temp: 97.7 F (36.5 C) 97.8 F (36.6 C)  SpO2: 99% 97%    Exam:  Constitutional:  . Appears calm, mildly uncomfortable, sitting in  chair ENMT:  . grossly normal hearing  Respiratory:  . Diminished breath sounds.  No frank wheezes, rales  or rhonchi. Marland Kitchen Respiratory effort normal Cardiovascular:  . RRR, no m/r/g . No LE extremity edema   Psychiatric:  . Mental status o Mood, affect appropriate  I have personally reviewed the following:   Labs:  Creatinine improved, 1.57.  BUN 46.  Potassium within normal limits.  WBC without significant change, 15.5.  Hemoglobin stable, 9.7.  Imaging studies:  Chest x-ray independently reviewed from yesterday 4/19 showed pulmonary edema.  Scheduled Meds: . acetaminophen (TYLENOL) oral liquid 160 mg/5 mL  650 mg Oral Q6H  . amLODipine  5 mg Oral Daily  . enoxaparin (LOVENOX) injection  30 mg Subcutaneous Q24H  . feeding supplement (ENSURE ENLIVE)  237 mL Oral BID BM  . furosemide  20 mg Intravenous Once  . levothyroxine  50 mcg Oral QAC breakfast  . metoprolol tartrate  50 mg Oral BID  . mometasone-formoterol  2 puff Inhalation BID  . polyethylene glycol  17 g Oral BID  . senna  1 tablet Oral QHS  . sertraline  25 mg Oral Daily   Continuous Infusions:  Principal Problem:   Acute hypoxemic respiratory failure (HCC) Active Problems:   Closed fracture of left proximal humerus   Left rib fracture   Pubic ramus fracture, left, closed, initial encounter (HCC)   Pelvic fracture (HCC)   CKD (chronic kidney disease), stage IV (HCC)   Dysphagia   Goals of care, counseling/discussion   Palliative care by specialist   Volume overload   LOS: 7 days    Time spent 35 minutes.  Greater than 50% counseling coordination of care.  Discussed with daughter at bedside.

## 2017-06-01 LAB — BASIC METABOLIC PANEL
Anion gap: 8 (ref 5–15)
BUN: 41 mg/dL — AB (ref 6–20)
CALCIUM: 8.3 mg/dL — AB (ref 8.9–10.3)
CHLORIDE: 104 mmol/L (ref 101–111)
CO2: 31 mmol/L (ref 22–32)
CREATININE: 1.4 mg/dL — AB (ref 0.44–1.00)
GFR calc non Af Amer: 31 mL/min — ABNORMAL LOW (ref >60)
GFR, EST AFRICAN AMERICAN: 36 mL/min — AB (ref >60)
Glucose, Bld: 112 mg/dL — ABNORMAL HIGH (ref 65–99)
Potassium: 3.4 mmol/L — ABNORMAL LOW (ref 3.5–5.1)
SODIUM: 143 mmol/L (ref 135–145)

## 2017-06-01 LAB — CBC
HCT: 31.5 % — ABNORMAL LOW (ref 36.0–46.0)
HEMOGLOBIN: 9.8 g/dL — AB (ref 12.0–15.0)
MCH: 29.5 pg (ref 26.0–34.0)
MCHC: 31.1 g/dL (ref 30.0–36.0)
MCV: 94.9 fL (ref 78.0–100.0)
PLATELETS: 282 10*3/uL (ref 150–400)
RBC: 3.32 MIL/uL — ABNORMAL LOW (ref 3.87–5.11)
RDW: 15.7 % — AB (ref 11.5–15.5)
WBC: 14.9 10*3/uL — ABNORMAL HIGH (ref 4.0–10.5)

## 2017-06-01 MED ORDER — POTASSIUM CHLORIDE CRYS ER 20 MEQ PO TBCR
40.0000 meq | EXTENDED_RELEASE_TABLET | Freq: Once | ORAL | Status: AC
  Administered 2017-06-01: 40 meq via ORAL
  Filled 2017-06-01: qty 2

## 2017-06-01 NOTE — Progress Notes (Signed)
PROGRESS NOTE    Meagan BostonDelia P Osborn  ZOX:[id number] DOB: 20-Apr-1920 DOA: 05/23/2017 PCP: Barbie BannerWilson, Fred H, MD     Brief Narrative:  Meagan BostonDelia P Osborn is a 84 year old woman PMH asthma, chronic kidney disease stage IV, dementia presented to the emergency department after a fall at home, unwitnessed.  Per family, patient has a history of chronic orthostatic dizziness.  Workup revealed multiple fractures including superior and inferior pubic rami, left rib fracture, left humeral fracture.  She was admitted for further evaluation by orthopedics.  She was transferred to [facility] and underwent operative fixation of her left humeral fracture 4/15.  Subsequently developed volume overload from IV fluids and has been slow to recover.  Assessment & Plan:   Principal Problem:   Acute hypoxemic respiratory failure (HCC) Active Problems:   Closed fracture of left proximal humerus   Left rib fracture   Pubic ramus fracture, left, closed, initial encounter (HCC)   Pelvic fracture (HCC)   CKD (chronic kidney disease), stage IV (HCC)   Dysphagia   Goals of care, counseling/discussion   Palliative care by specialist   Volume overload   Acute hypoxic respiratory failure secondary to pulmonary edema, secondary to volume overload, complicated by chronic diastolic dysfunction/CHF, possibly also by moderate pulmonary hypertension --Has received IV diuresis. Breathing remains stable this morning  --Wean O2 as able   Closed fracture left proximal humerus status post fall at home, likely orthostatic in nature.  Status post operative repair 4/15.  --Nonweightbearing left upper extremity. Continue sling at all times --Follow-up with Dr. Dion SaucierLandau first week in May.  Pubic ramus fracture, left, closed --Weight-bear as tolerated per orthopedics  Acute displaced left sixth rib fracture. --Supportive treatment per orthopedics  Dysphagia -- Diet recommendations: Regular;Thin liquid (however using dysphagia 3 diet  for ease of chewing) Liquids provided via: Cup;Straw Medication Administration: Crushed with puree Supervision: Patient able to self feed;Staff to assist with self feeding Compensations: Minimize environmental distractions Postural Changes and/or Swallow Maneuvers: Seated upright 90 degrees  Acute blood loss anemia superimposed on chronic anemia, type unknown, baseline approximately 10.2-10.5 per care everywhere.. --Stable status post transfusion.  Appears to be at baseline.  Leukocytosis.  Persistent.  This is a new finding this hospitalization. --Likely related to fractures.  Follow clinically.  Asthma --Appears stable.  Hypothyroidism.  TSH within normal limits. --Continue levothyroxine.  Chronic kidney disease stage IV.  Baseline approximately 1.5-1.9 in care everywhere. --At baseline.  Essential hypertension --On beta-blocker and amlodipine --BP improved today   Dementia --Follow-up as an outpatient.  Hypokalemia --Replace, trend     DVT prophylaxis: Lovenox Code Status: DNR Family Communication: Daughter at bedside Disposition Plan: Pending improvement, SNF on discharge   Consultants:   Orthopedic surgery   Palliative medicine   Procedures:   OPEN REDUCTION INTERNAL FIXATION (ORIF) PROXIMAL HUMERUS FRACTURE  Echo Impressions: - Normal LV size with mild LV hypertrophy. EF 55-60%. Moderate diastolic dysfunction. Normal RV size and systolic function. Aortic valve sclerosis without significant stenosis. Calcified mitral valve and annulus. There was mild mitral regurgitation and probably mild mitral stenosis. Moderate pulmonary hypertension.   Antimicrobials:  Anti-infectives (From admission, onward)   Start     Dose/Rate Route Frequency Ordered Stop   05/26/17 2230  ceFAZolin (ANCEF) IVPB 1 g/50 mL premix     1 g 100 mL/hr over 30 Minutes Intravenous Every 6 hours 05/26/17 2036 05/27/17 1154   05/26/17 1527  ceFAZolin (ANCEF) 2-4  GM/100ML-% IVPB    Note to Pharmacy:  Sabino Niemann   : cabinet override      05/26/17 1527 05/27/17 0344   05/25/17 0600  ceFAZolin (ANCEF) IVPB 2g/100 mL premix  Status:  Discontinued     2 g 200 mL/hr over 30 Minutes Intravenous On call to O.R. 05/24/17 2044 05/25/17 1319        Subjective: No new complaints today.  Daughter at bedside and patient is eating breakfast.  Appears to have improved overall.  Discussed importance of physical therapy.  Objective: Vitals:   06/01/17 0450 06/01/17 0701 06/01/17 0721 06/01/17 0929  BP: (!) 191/76 (!) 150/58 (!) 155/54   Pulse: 65 70 72   Resp: 17  18   Temp: (!) 97.5 F (36.4 C)  97.6 F (36.4 C)   TempSrc: Oral  Oral   SpO2: 99%  97% 98%  Weight:      Height:        Intake/Output Summary (Last 24 hours) at 06/01/2017 1548 Last data filed at 06/01/2017 1410 Gross per 24 hour  Intake 320 ml  Output 1450 ml  Net -1130 ml   Filed Weights   05/23/17 2145  Weight: 52.2 kg (115 lb)    Examination:  General exam: Appears calm and comfortable  Respiratory system: Clear to auscultation, diminished bases. Respiratory effort normal. Cardiovascular system: S1 & S2 heard, RRR. No JVD, murmurs, rubs, gallops or clicks. No pedal edema. Gastrointestinal system: Abdomen is nondistended, soft and nontender. No organomegaly or masses felt. Normal bowel sounds heard. Central nervous system: Alert and oriented. No focal neurological deficits. Extremities: +LUE in sling  Skin: No rashes, lesions or ulcers Psychiatry: Judgement and insight appear normal. Mood & affect appropriate.   Data Reviewed: I have personally reviewed following labs and imaging studies  CBC: Recent Labs  Lab 05/27/17 0608 05/28/17 0652 05/29/17 0611 05/31/17 0519 06/01/17 0815  WBC 15.9* 14.1* 16.3* 15.5* 14.9*  HGB 9.7* 9.3* 9.7* 9.7* 9.8*  HCT 30.6* 28.7* 30.0* 30.9* 31.5*  MCV 93.0 93.5 93.8 94.5 94.9  PLT 212 201 233 275 282   Basic Metabolic  Panel: Recent Labs  Lab 05/27/17 0608 05/28/17 0652 05/29/17 0611 05/31/17 0519 06/01/17 0815  NA 143 138 142 142 143  K 4.1 4.7 4.3 3.5 3.4*  CL 110 110 110 103 104  CO2 22 21* 23 29 31   GLUCOSE 111* 122* 113* 89 112*  BUN 40* 44* 49* 46* 41*  CREATININE 1.94* 1.81* 1.79* 1.57* 1.40*  CALCIUM 7.9* 7.9* 8.2* 8.1* 8.3*   GFR: Estimated Creatinine Clearance: 18.5 mL/min (A) (by C-G formula based on SCr of 1.4 mg/dL (H)). Liver Function Tests: No results for input(s): AST, ALT, ALKPHOS, BILITOT, PROT, ALBUMIN in the last 168 hours. No results for input(s): LIPASE, AMYLASE in the last 168 hours. No results for input(s): AMMONIA in the last 168 hours. Coagulation Profile: No results for input(s): INR, PROTIME in the last 168 hours. Cardiac Enzymes: No results for input(s): CKTOTAL, CKMB, CKMBINDEX, TROPONINI in the last 168 hours. BNP (last 3 results) No results for input(s): PROBNP in the last 8760 hours. HbA1C: No results for input(s): HGBA1C in the last 72 hours. CBG: No results for input(s): GLUCAP in the last 168 hours. Lipid Profile: No results for input(s): CHOL, HDL, LDLCALC, TRIG, CHOLHDL, LDLDIRECT in the last 72 hours. Thyroid Function Tests: No results for input(s): TSH, T4TOTAL, FREET4, T3FREE, THYROIDAB in the last 72 hours. Anemia Panel: No results for input(s): VITAMINB12, FOLATE, FERRITIN, TIBC, IRON, RETICCTPCT in the last  72 hours. Sepsis Labs: No results for input(s): PROCALCITON, LATICACIDVEN in the last 168 hours.  Recent Results (from the past 240 hour(s))  Surgical pcr screen     Status: None   Collection Time: 05/24/17 10:26 PM  Result Value Ref Range Status   MRSA, PCR NEGATIVE NEGATIVE Final   Staphylococcus aureus NEGATIVE NEGATIVE Final    Comment: (NOTE) The Xpert SA Assay (FDA approved for NASAL specimens in patients 68 years of age and older), is one component of a comprehensive surveillance program. It is not intended to diagnose  infection nor to guide or monitor treatment. Performed at [facility], [address] [place], [place] [address]        Radiology Studies: No results found.    Scheduled Meds: . acetaminophen (TYLENOL) oral liquid 160 mg/5 mL  650 mg Oral Q6H  . amLODipine  5 mg Oral Daily  . enoxaparin (LOVENOX) injection  30 mg Subcutaneous Q24H  . feeding supplement (ENSURE ENLIVE)  237 mL Oral BID BM  . levothyroxine  50 mcg Oral QAC breakfast  . metoprolol tartrate  25 mg Oral BID  . mometasone-formoterol  2 puff Inhalation BID  . polyethylene glycol  17 g Oral BID  . senna  1 tablet Oral QHS  . sertraline  25 mg Oral Daily   Continuous Infusions:   LOS: 8 days    Time spent: 35 minutes   Noralee Stain, DO [facility] [email or web address] Password [facility] 06/01/2017, 3:48 PM

## 2017-06-02 ENCOUNTER — Inpatient Hospital Stay (HOSPITAL_COMMUNITY): Payer: Medicare Other

## 2017-06-02 LAB — BASIC METABOLIC PANEL
Anion gap: 9 (ref 5–15)
BUN: 42 mg/dL — AB (ref 6–20)
CO2: 28 mmol/L (ref 22–32)
CREATININE: 1.39 mg/dL — AB (ref 0.44–1.00)
Calcium: 8.3 mg/dL — ABNORMAL LOW (ref 8.9–10.3)
Chloride: 103 mmol/L (ref 101–111)
GFR calc Af Amer: 36 mL/min — ABNORMAL LOW (ref >60)
GFR, EST NON AFRICAN AMERICAN: 31 mL/min — AB (ref >60)
GLUCOSE: 113 mg/dL — AB (ref 65–99)
POTASSIUM: 3.6 mmol/L (ref 3.5–5.1)
Sodium: 140 mmol/L (ref 135–145)

## 2017-06-02 LAB — CBC
HEMATOCRIT: 31.6 % — AB (ref 36.0–46.0)
Hemoglobin: 9.8 g/dL — ABNORMAL LOW (ref 12.0–15.0)
MCH: 29.3 pg (ref 26.0–34.0)
MCHC: 31 g/dL (ref 30.0–36.0)
MCV: 94.6 fL (ref 78.0–100.0)
PLATELETS: 328 10*3/uL (ref 150–400)
RBC: 3.34 MIL/uL — ABNORMAL LOW (ref 3.87–5.11)
RDW: 16.1 % — AB (ref 11.5–15.5)
WBC: 14.7 10*3/uL — AB (ref 4.0–10.5)

## 2017-06-02 MED ORDER — METOCLOPRAMIDE HCL 5 MG/ML IJ SOLN
5.0000 mg | Freq: Three times a day (TID) | INTRAMUSCULAR | Status: DC
  Administered 2017-06-02 – 2017-06-03 (×4): 5 mg via INTRAVENOUS
  Filled 2017-06-02 (×4): qty 2

## 2017-06-02 MED ORDER — METOPROLOL TARTRATE 25 MG PO TABS: 25.0000 mg | ORAL_TABLET | Freq: Two times a day (BID) | ORAL | 0 refills | Status: DC

## 2017-06-02 MED ORDER — AMLODIPINE BESYLATE 10 MG PO TABS
10.0000 mg | ORAL_TABLET | Freq: Every day | ORAL | Status: DC
Start: 2017-06-02 — End: 2017-06-03
  Administered 2017-06-02 – 2017-06-03 (×2): 10 mg via ORAL
  Filled 2017-06-02 (×2): qty 1

## 2017-06-02 MED ORDER — ACETAMINOPHEN 160 MG/5ML PO SOLN: 650.0000 mg | Freq: Four times a day (QID) | ORAL | 0 refills | Status: AC

## 2017-06-02 MED ORDER — ONDANSETRON HCL 4 MG/2ML IJ SOLN
4.0000 mg | Freq: Four times a day (QID) | INTRAMUSCULAR | Status: DC | PRN
  Administered 2017-06-02: 4 mg via INTRAVENOUS
  Filled 2017-06-02: qty 2

## 2017-06-02 MED ORDER — ENOXAPARIN SODIUM 30 MG/0.3ML ~~LOC~~ SOLN: 30.0000 mg | SUBCUTANEOUS | 0 refills | Status: DC

## 2017-06-02 MED ORDER — AMLODIPINE BESYLATE 10 MG PO TABS: 10.0000 mg | ORAL_TABLET | Freq: Every day | ORAL | 0 refills | Status: AC

## 2017-06-02 NOTE — Clinical Social Work Note (Addendum)
CSW advised by nurse Kami that patient's discharge has been cancelled. Meagan KarvonenKanesha, [facility] contacted and informed. Call made to patient's daughter, Meagan Osborn [phone]) and informed her that discharge cancelled. Daughter also advised that [facility] is not [facility] for patient. Ms. Meagan Osborn indicated that once patient ready for d/c, if [facility] does not have a bed, she will take her home.   Call received from daughter (3:28 pm) regarding her mom's discharge. Daughter is aware that she cannot care for he mother at home and asked about [facility] (if they can't take patient once medically stable), when a bed is available and this was discussed. Ms. Meagan Osborn also asked about transport options and was advised that [facility] can transport and she could talk with skilled facility regarding arranging the transportation.  CSW will continue to monitor patient's progress, provide SW intervention services as needed and assist with discharge to a skilled facility if bed available.   Meagan BalVanessa Marcos Osborn, MSW, LCSW Licensed Clinical Social Worker Clinical Social Work Department [facility] [phone]

## 2017-06-02 NOTE — Discharge Summary (Addendum)
Physician Discharge Summary  Meagan Osborn ZOX:[id number] DOB: Sep 29, 1920 DOA: 05/23/2017  PCP: Barbie Banner, MD  Admit date: 05/23/2017 Discharge date: 06/03/2017  Admitted From: Home Disposition:  SNF  Recommendations for Outpatient Follow-up:  1. Follow up with PCP in 1 week 2. Follow up with Ortho Dr. Dion Saucier in 2 weeks  3. Please obtain BMP/CBC in 1 week  4. NWB left upper ext. Continue sling at all times  Discharge Condition: Stable CODE STATUS: DNR  Diet recommendation: Regular, dysphagia 3 diet   Brief/Interim Summary: Meagan Osborn is a 84 year old woman PMH asthma, chronic kidney disease stage IV, dementia presented to the emergency department after a fall at home, unwitnessed. Per family, patient has a history of chronic orthostatic dizziness. Workup revealed multiple fractures including superior and inferior pubic rami, left rib fracture, left humeral fracture. She was admitted for further evaluation by orthopedics. She was transferred to [facility] and underwent operative fixation of her left humeral fracture 4/15.She received 2u pRBC prior to surgical operation with questionable blood transfusion reaction with elevated BP. She has also had some delirium intermittently with narcotic pain medications. Subsequently developed volume overload from IV fluids and has been slow to recover and has received IV lasix. On day of discharge, patient was weaned off [place] O2 and doing well.   Discharge was delayed due to vomiting after meal. KUB was negative for obstruction. On 4/23, she tolerated breakfast without issue. She was prescribed reglan and zofran prn on discharge.   Discharge Diagnoses:  Principal Problem:   Acute hypoxemic respiratory failure (HCC) Active Problems:   Closed fracture of left proximal humerus   Left rib fracture   Pubic ramus fracture, left, closed, initial encounter (HCC)   Pelvic fracture (HCC)   CKD (chronic kidney disease), stage IV (HCC)    Dysphagia   Goals of care, counseling/discussion   Palliative care by specialist   Volume overload   Closed fracture left proximal humerus status post fall at home, likely orthostatic in nature. Status post operative repair 4/15.  --Nonweightbearing left upper extremity.Continue sling at all times --Follow-up with Dr. Dion Saucier first week in May.  Pubic ramus fracture, left, closed --Weight-bear as tolerated per orthopedics  Acute displaced left sixth rib fracture --Supportive treatment per orthopedics  Acute hypoxic respiratory failure secondary to pulmonary edema, secondary to volume overload, acute on chronic chronic diastolic dysfunction, possibly also by moderate pulmonary hypertension --Has received IV diuresis. Breathing remains stable this morning  --Off [place] O2 and currently satting well on room air  Dysphagia --Diet recommendations: Regular;Thin liquid(however using dysphagia 3 diet for ease of chewing) Liquids provided via: Cup;Straw Medication Administration: Crushed with puree Supervision: Patient able to self feed;Staff to assist with self feeding Compensations: Minimize environmental distractions Postural Changes and/or Swallow Maneuvers: Seated upright 90 degrees  Acute blood loss anemiasuperimposed on chronic anemia, type unknown, baseline approximately 10.2-10.5 per care everywhere.. --Stable status post transfusion. Appears to be at baseline.  Leukocytosis. Persistent. This is a new finding this hospitalization. --Likely related to fractures, reactive leukocytosis.  No signs of infectious process. Follow clinically.  Asthma --Appears stable.  Hypothyroidism. TSH within normal limits. --Continue levothyroxine.  AKI on Chronic kidney disease stage IV.Baseline approximately 1.5-1.9 in care everywhere. --Now back to baseline.  Essential hypertension --On beta-blocker and amlodipine, dose increased   Dementia --Follow-up as an  outpatient.     Discharge Instructions  Discharge Instructions    Call MD for:  difficulty breathing, headache or visual disturbances  Complete by:  As directed    Call MD for:  extreme fatigue   Complete by:  As directed    Call MD for:  hives   Complete by:  As directed    Call MD for:  persistant dizziness or light-headedness   Complete by:  As directed    Call MD for:  persistant nausea and vomiting   Complete by:  As directed    Call MD for:  redness, tenderness, or signs of infection (pain, swelling, redness, odor or green/yellow discharge around incision site)   Complete by:  As directed    Call MD for:  severe uncontrolled pain   Complete by:  As directed    Call MD for:  temperature >100.4   Complete by:  As directed    Discharge instructions   Complete by:  As directed    You were cared for by a hospitalist during your hospital stay. If you have any questions about your discharge medications or the care you received while you were in the hospital after you are discharged, you can call the unit and asked to speak with the hospitalist on call if the hospitalist that took care of you is not available. Once you are discharged, your primary care physician will handle any further medical issues. Please note that NO REFILLS for any discharge medications will be authorized once you are discharged, as it is imperative that you return to your primary care physician (or establish a relationship with a primary care physician if you do not have one) for your aftercare needs so that they can reassess your need for medications and monitor your lab values.   Increase activity slowly   Complete by:  As directed    Non weight bearing   Complete by:  As directed    Laterality:  left   Extremity:  Upper   Weight bearing as tolerated   Complete by:  As directed    Laterality:  bilateral   Extremity:  Lower     Allergies as of 06/03/2017      Reactions   Sulfa Antibiotics Other (See  Comments)   unknown      Medication List    STOP taking these medications   azithromycin 250 MG tablet Commonly known as:  ZITHROMAX     TAKE these medications   acetaminophen 160 MG/5ML solution Commonly known as:  TYLENOL Take 20.3 mLs (650 mg total) by mouth every 6 (six) hours.   amLODipine 10 MG tablet Commonly known as:  NORVASC Take 1 tablet (10 mg total) by mouth daily. What changed:    medication strength  how much to take  when to take this   enoxaparin 30 MG/0.3ML injection Commonly known as:  LOVENOX Inject 0.3 mLs (30 mg total) into the skin daily for 14 days.   hydrochlorothiazide 12.5 MG capsule Commonly known as:  MICROZIDE Take 1 capsule (12.5 mg total) by mouth daily. Start taking on:  06/04/2017   levalbuterol 45 MCG/ACT inhaler Commonly known as:  XOPENEX HFA Inhale 2 puffs into the lungs every 4 (four) hours as needed for wheezing or shortness of breath.   levothyroxine 50 MCG tablet Commonly known as:  SYNTHROID, LEVOTHROID Take 50 mcg by mouth daily before breakfast.   loratadine 10 MG tablet Commonly known as:  CLARITIN Take 10 mg by mouth daily.   metoCLOPramide 5 MG tablet Commonly known as:  REGLAN Take 1 tablet (5 mg total) by mouth every 8 (eight)  hours as needed for nausea, vomiting or refractory nausea / vomiting.   metoprolol tartrate 25 MG tablet Commonly known as:  LOPRESSOR Take 1 tablet (25 mg total) by mouth 2 (two) times daily.   ondansetron 4 MG tablet Commonly known as:  ZOFRAN Take 1 tablet (4 mg total) by mouth every 8 (eight) hours as needed for nausea or vomiting.   sertraline 25 MG tablet Commonly known as:  ZOLOFT Take 25 mg by mouth daily.   SYMBICORT 80-4.5 MCG/ACT inhaler Generic drug:  budesonide-formoterol INHALE 2 PUFFS INTO THE LUNGS TWICE A DAY            Discharge Care Instructions  (From admission, onward)        Start     Ordered   05/26/17 0000  Non weight bearing    Question  Answer Comment  Laterality left   Extremity Upper      05/26/17 1601   05/26/17 0000  Weight bearing as tolerated    Question Answer Comment  Laterality bilateral   Extremity Lower      05/26/17 1601      Contact information for follow-up providers    Teryl Lucy, MD. Schedule an appointment as soon as possible for a visit in 2 weeks.   Specialty:  Orthopedic Surgery Contact information: [address] [place] [address] [phone]        Barbie Banner, MD. Schedule an appointment as soon as possible for a visit in 1 week(s).   Specialty:  Family Medicine Contact information: [address] [place] [address] [phone]            Contact information for after-discharge care    Destination    [facility] .   Service:  Skilled Nursing Contact information: [address] [place] [address] [phone]                 Allergies  Allergen Reactions  . Sulfa Antibiotics Other (See Comments)    unknown    Consultations:  Orthopedic surgery  Palliative medicine    Procedures/Studies: Dg Chest 1 View  Result Date: 05/23/2017 CLINICAL DATA:  Fall EXAM: CHEST  1 VIEW COMPARISON:  06/13/2016 FINDINGS: Mild cardiomegaly. No consolidation or pleural effusion. Calcified granuloma bilaterally. Aortic atherosclerosis. No pneumothorax. Acute displaced proximal left humerus fracture. Acute displaced left sixth rib fracture IMPRESSION: 1. Negative for pneumothorax or pleural effusion 2. Acute displaced left 6 rib fracture. Acute displaced proximal humerus fracture on the left 3. Mild cardiomegaly Electronically Signed   By: Jasmine Pang M.D.   On: 05/23/2017 23:16   Dg Shoulder 1v Left  Result Date: 05/24/2017 CLINICAL DATA:  Known proximal left humerus fracture EXAM: LEFT SHOULDER - 1 VIEW COMPARISON:  05/23/2017 humerus and CXR radiographs FINDINGS: Acute left surgical neck fracture of the humerus  with 6 mm of medial displacement of the humeral head is identified on current exam. The 45 degrees or so of angulation measured on prior study is less apparent on this single view. The acute displaced left sixth rib fracture seen on prior exams is obscured due to overlap on current study. IMPRESSION: 1. 6 mm of medial displacement of known acute surgical neck fracture of the left humerus is seen on this single frontal view of the left shoulder. The 45 degrees of angulation is not apparent on this study. 2. The acute minimally displaced left sixth rib fracture is also obscured on current exam. Electronically Signed  By: Tollie Eth M.D.   On: 05/24/2017 18:34   Dg Abd 1 View  Result Date: 06/02/2017 CLINICAL DATA:  Vomiting.  No abdominal pain. EXAM: ABDOMEN - 1 VIEW COMPARISON:  None. FINDINGS: The bowel gas pattern is normal. Small round calcifications over the area of the left renal shadow. No acute osseous abnormality. IMPRESSION: 1. No evidence of obstruction. 2. Small round calcifications over the area of the left renal shadow, possibly small calculi. Electronically Signed   By: Obie Dredge M.D.   On: 06/02/2017 20:48   Ct Head Wo Contrast  Result Date: 05/23/2017 CLINICAL DATA:  Pain after head trauma. EXAM: CT HEAD WITHOUT CONTRAST CT CERVICAL SPINE WITHOUT CONTRAST TECHNIQUE: Multidetector CT imaging of the head and cervical spine was performed following the standard protocol without intravenous contrast. Multiplanar CT image reconstructions of the cervical spine were also generated. COMPARISON:  None. FINDINGS: CT HEAD FINDINGS Brain: Moderate chronic appearing small vessel ischemia with atrophy. Large vascular territory infarct, intra-axial mass nor extra-axial fluid collections. Midline fourth ventricle and basal cisterns. No effacement. Vascular: Atherosclerosis of the carotid siphons. No hyperdense vessel sign. Skull: No skull fracture or suspicious osseous lesions. Arachnoid granulations  along the occiput of the skull. Sinuses/Orbits: Clear mastoids. Bilateral lens replacements. Ethmoid and sphenoid sinus mucosal thickening without air-fluid levels. Other: Left parietal scalp contusion. CT CERVICAL SPINE FINDINGS Alignment: Degenerative disc and facet mediated grade 1 anterolisthesis of C4 on C5 with marked disc space narrowing. Intact craniocervical relationship and atlantodental interval. Skull base and vertebrae: Marked osteoarthritis of the atlantodental interval with sclerosis and joint space narrowing. No skull base nor cervical spine fracture. Degenerative subcortical cystic change of the odontoid process. Soft tissues and spinal canal: No prevertebral soft tissue swelling. No visible canal hematoma. Disc levels: Cervical spondylitic change most pronounced from C4 through C7. Lesser degree of disc space narrowing C2-3 and C3-4. Multilevel degenerative facet arthropathy, ankylosed at C5-6 on the right. Uncovertebral joint osteoarthritis from C3-4 on the right and bilaterally at C4-5, C5-6 and C6-7 contributing to mild foraminal encroachment at C5-6 and C6-7 bilaterally. Upper chest: Biapical pleuroparenchymal scarring. Other: Extracranial carotid arteriosclerosis bilaterally. IMPRESSION: 1. Left parietal scalp contusion without underlying skull fracture. 2. Cerebral atrophy with chronic appearing small vessel ischemia. 3. Cervical spondylosis without acute cervical spine fracture. 4. Grade 1 anterolisthesis of C4 on C5 mediated by degenerative disc and facet arthropathy. Electronically Signed   By: Tollie Eth M.D.   On: 05/23/2017 23:00   Ct Cervical Spine Wo Contrast  Result Date: 05/23/2017 CLINICAL DATA:  Pain after head trauma. EXAM: CT HEAD WITHOUT CONTRAST CT CERVICAL SPINE WITHOUT CONTRAST TECHNIQUE: Multidetector CT imaging of the head and cervical spine was performed following the standard protocol without intravenous contrast. Multiplanar CT image reconstructions of the cervical  spine were also generated. COMPARISON:  None. FINDINGS: CT HEAD FINDINGS Brain: Moderate chronic appearing small vessel ischemia with atrophy. Large vascular territory infarct, intra-axial mass nor extra-axial fluid collections. Midline fourth ventricle and basal cisterns. No effacement. Vascular: Atherosclerosis of the carotid siphons. No hyperdense vessel sign. Skull: No skull fracture or suspicious osseous lesions. Arachnoid granulations along the occiput of the skull. Sinuses/Orbits: Clear mastoids. Bilateral lens replacements. Ethmoid and sphenoid sinus mucosal thickening without air-fluid levels. Other: Left parietal scalp contusion. CT CERVICAL SPINE FINDINGS Alignment: Degenerative disc and facet mediated grade 1 anterolisthesis of C4 on C5 with marked disc space narrowing. Intact craniocervical relationship and atlantodental interval. Skull base and vertebrae: Marked osteoarthritis of the  atlantodental interval with sclerosis and joint space narrowing. No skull base nor cervical spine fracture. Degenerative subcortical cystic change of the odontoid process. Soft tissues and spinal canal: No prevertebral soft tissue swelling. No visible canal hematoma. Disc levels: Cervical spondylitic change most pronounced from C4 through C7. Lesser degree of disc space narrowing C2-3 and C3-4. Multilevel degenerative facet arthropathy, ankylosed at C5-6 on the right. Uncovertebral joint osteoarthritis from C3-4 on the right and bilaterally at C4-5, C5-6 and C6-7 contributing to mild foraminal encroachment at C5-6 and C6-7 bilaterally. Upper chest: Biapical pleuroparenchymal scarring. Other: Extracranial carotid arteriosclerosis bilaterally. IMPRESSION: 1. Left parietal scalp contusion without underlying skull fracture. 2. Cerebral atrophy with chronic appearing small vessel ischemia. 3. Cervical spondylosis without acute cervical spine fracture. 4. Grade 1 anterolisthesis of C4 on C5 mediated by degenerative disc and  facet arthropathy. Electronically Signed   By: Tollie Eth M.D.   On: 05/23/2017 23:00   Dg Chest Port 1 View  Result Date: 05/30/2017 CLINICAL DATA:  Fluid excess EXAM: PORTABLE CHEST 1 VIEW COMPARISON:  05/28/2017 FINDINGS: There are moderate bilateral pleural effusions and bibasilar opacities. Heart size is enlarged but also obscured by bibasilar opacities which appear stable. There is mild vascular prominence. IMPRESSION: Persistent pulmonary changes consistent with pulmonary edema. Electronically Signed   By: Norva Pavlov M.D.   On: 05/30/2017 09:23   Dg Chest Port 1 View  Result Date: 05/28/2017 CLINICAL DATA:  Dyspnea EXAM: PORTABLE CHEST 1 VIEW COMPARISON:  Five days ago FINDINGS: There is chronic cardiomegaly. Haziness of the lower chest from layering effusions and presumed atelectasis. Interstitial coarsening that is likely edema. Known left-sided rib fracture. IMPRESSION: Bilateral layering pleural effusion and atelectasis has developed since study 5 days prior. Vascular congestion. Electronically Signed   By: Marnee Spring M.D.   On: 05/28/2017 13:47   Dg Humerus Left  Result Date: 05/26/2017 CLINICAL DATA:  84 year old female with left humerus fracture. Subsequent encounter. EXAM: DG C-ARM 61-120 MIN; LEFT HUMERUS - 2+ VIEW Fluoroscopic time: 55 seconds. COMPARISON:  05/24/2017. FINDINGS: Two intraoperative C-arm views submitted for review after surgery. This reveals plate and screws transfixing left humeral surgical neck fracture. IMPRESSION: Open reduction and internal fixation left humeral surgical neck fracture. Electronically Signed   By: Lacy Duverney M.D.   On: 05/26/2017 18:17   Dg Humerus Left  Result Date: 05/23/2017 CLINICAL DATA:  Fall EXAM: LEFT HUMERUS - 2+ VIEW COMPARISON:  None. FINDINGS: Acute fracture involving the proximal shaft of the humerus with moderate varus angulation of distal fracture fragment and about 1/2 bone with displacement of distal fracture  fragment. Left humeral head projects over the glenoid. Mild overriding. IMPRESSION: Acute displaced and angulated proximal humerus fracture Electronically Signed   By: Jasmine Pang M.D.   On: 05/23/2017 23:12   Dg C-arm 1-60 Min  Result Date: 05/26/2017 CLINICAL DATA:  84 year old female with left humerus fracture. Subsequent encounter. EXAM: DG C-ARM 61-120 MIN; LEFT HUMERUS - 2+ VIEW Fluoroscopic time: 55 seconds. COMPARISON:  05/24/2017. FINDINGS: Two intraoperative C-arm views submitted for review after surgery. This reveals plate and screws transfixing left humeral surgical neck fracture. IMPRESSION: Open reduction and internal fixation left humeral surgical neck fracture. Electronically Signed   By: Lacy Duverney M.D.   On: 05/26/2017 18:17   Dg C-arm 1-60 Min  Result Date: 05/26/2017 CLINICAL DATA:  84 year old female with left humerus fracture. Subsequent encounter. EXAM: DG C-ARM 61-120 MIN; LEFT HUMERUS - 2+ VIEW Fluoroscopic time: 55 seconds. COMPARISON:  05/24/2017. FINDINGS: Two intraoperative C-arm views submitted for review after surgery. This reveals plate and screws transfixing left humeral surgical neck fracture. IMPRESSION: Open reduction and internal fixation left humeral surgical neck fracture. Electronically Signed   By: Lacy DuverneySteven  Olson M.D.   On: 05/26/2017 18:17   Dg Hips Bilat With Pelvis Min 5 Views  Result Date: 05/23/2017 CLINICAL DATA:  Fall EXAM: DG HIP (WITH OR WITHOUT PELVIS) 5+V BILAT COMPARISON:  None. FINDINGS: Mild SI joint degenerative changes. Right femoral head projects in joint. Possible old right superior pubic ramus fracture. Proximal left femur is intact. Acute fractures involving the left superior and inferior pubic rami. IMPRESSION: 1. Acute minimally displaced fractures involving the left superior and inferior pubic rami 2. No definite acute osseous abnormality of the right hip Electronically Signed   By: Jasmine PangKim  Fujinaga M.D.   On: 05/23/2017 23:14     Echo Study Conclusions  - Left ventricle: The cavity size was normal. Wall thickness was   increased in a pattern of mild LVH. Systolic function was normal.   The estimated ejection fraction was in the range of 55% to 60%.   Wall motion was normal; there were no regional wall motion   abnormalities. Features are consistent with a pseudonormal left   ventricular filling pattern, with concomitant abnormal relaxation   and increased filling pressure (grade 2 diastolic dysfunction). - Aortic valve: Trileaflet; moderately calcified leaflets.   Sclerosis without stenosis. Mean gradient (S): 7 mm Hg. Valve   area (VTI): 1.92 cm^2. Valve area (Vmean): 1.59 cm^2. - Mitral valve: Moderately to severely calcified annulus.   Moderately calcified leaflets . The findings are consistent with   mild stenosis. There was mild regurgitation. Mean gradient (D): 8   mm Hg. Valve area by pressure half-time: 2.64 cm^2. - Left atrium: The atrium was moderately dilated. - Right ventricle: The cavity size was normal. Systolic function   was normal. - Tricuspid valve: Peak RV-RA gradient (S): 64 mm Hg. - Pulmonary arteries: PA peak pressure: 67 mm Hg (S). - Inferior vena cava: The vessel was normal in size. The   respirophasic diameter changes were in the normal range (>= 50%),   consistent with normal central venous pressure. - Pericardium, extracardiac: There is a pleural effusion. A trivial   pericardial effusion was identified.  Impressions:  - Normal LV size with mild LV hypertrophy. EF 55-60%. Moderate   diastolic dysfunction. Normal RV size and systolic function.   Aortic valve sclerosis without significant stenosis. Calcified   mitral valve and annulus. There was mild mitral regurgitation and   probably mild mitral stenosis. Moderate pulmonary hypertension.    Discharge Exam: Vitals:   06/03/17 0910 06/03/17 0925  BP: (!) 197/69   Pulse: 66   Resp:    Temp: 97.8 F (36.6 C)   SpO2:  93% 91%    General: Pt is alert, awake, not in acute distress Cardiovascular: RRR, S1/S2 +, no rubs, no gallops Respiratory: CTA bilaterally, no wheezing, no rhonchi, on room air, no distress  Abdominal: Soft, NT, ND, bowel sounds + Extremities: no edema, no cyanosis    The results of significant diagnostics from this hospitalization (including imaging, microbiology, ancillary and laboratory) are listed below for reference.     Microbiology: Recent Results (from the past 240 hour(s))  Surgical pcr screen     Status: None   Collection Time: 05/24/17 10:26 PM  Result Value Ref Range Status   MRSA, PCR NEGATIVE NEGATIVE Final   Staphylococcus  aureus NEGATIVE NEGATIVE Final    Comment: (NOTE) The Xpert SA Assay (FDA approved for NASAL specimens in patients 66 years of age and older), is one component of a comprehensive surveillance program. It is not intended to diagnose infection nor to guide or monitor treatment. Performed at [facility], [address] [place], [place] [address]      Labs: BNP (last 3 results) Recent Labs    05/24/17 0010  BNP 375.8*   Basic Metabolic Panel: Recent Labs  Lab 05/29/17 0611 05/31/17 0519 06/01/17 0815 06/02/17 0451 06/03/17 0909  NA 142 142 143 140 142  K 4.3 3.5 3.4* 3.6 3.8  CL 110 103 104 103 103  CO2 23 29 31 28 29   GLUCOSE 113* 89 112* 113* 150*  BUN 49* 46* 41* 42* 41*  CREATININE 1.79* 1.57* 1.40* 1.39* 1.42*  CALCIUM 8.2* 8.1* 8.3* 8.3* 8.7*   Liver Function Tests: No results for input(s): AST, ALT, ALKPHOS, BILITOT, PROT, ALBUMIN in the last 168 hours. No results for input(s): LIPASE, AMYLASE in the last 168 hours. No results for input(s): AMMONIA in the last 168 hours. CBC: Recent Labs  Lab 05/29/17 0611 05/31/17 0519 06/01/17 0815 06/02/17 0451 06/03/17 0909  WBC 16.3* 15.5* 14.9* 14.7* 16.2*  HGB 9.7* 9.7* 9.8* 9.8* 10.4*  HCT 30.0* 30.9* 31.5* 31.6* 33.1*  MCV 93.8 94.5 94.9 94.6  94.3  PLT 233 275 282 328 355   Cardiac Enzymes: No results for input(s): CKTOTAL, CKMB, CKMBINDEX, TROPONINI in the last 168 hours. BNP: Invalid input(s): POCBNP CBG: No results for input(s): GLUCAP in the last 168 hours. D-Dimer No results for input(s): DDIMER in the last 72 hours. Hgb A1c No results for input(s): HGBA1C in the last 72 hours. Lipid Profile No results for input(s): CHOL, HDL, LDLCALC, TRIG, CHOLHDL, LDLDIRECT in the last 72 hours. Thyroid function studies No results for input(s): TSH, T4TOTAL, T3FREE, THYROIDAB in the last 72 hours.  Invalid input(s): FREET3 Anemia work up No results for input(s): VITAMINB12, FOLATE, FERRITIN, TIBC, IRON, RETICCTPCT in the last 72 hours. Urinalysis    Component Value Date/Time   COLORURINE YELLOW (A) 05/26/2017 0630   APPEARANCEUR CLEAR 05/26/2017 0630   LABSPEC 1.025 05/26/2017 0630   PHURINE 5.0 05/26/2017 0630   GLUCOSEU NEGATIVE 05/26/2017 0630   HGBUR TRACE (A) 05/26/2017 0630   BILIRUBINUR NEGATIVE 05/26/2017 0630   KETONESUR NEGATIVE 05/26/2017 0630   PROTEINUR TRACE (A) 05/26/2017 0630   NITRITE NEGATIVE 05/26/2017 0630   LEUKOCYTESUR TRACE (A) 05/26/2017 0630   Sepsis Labs Invalid input(s): PROCALCITONIN,  WBC,  LACTICIDVEN Microbiology Recent Results (from the past 240 hour(s))  Surgical pcr screen     Status: None   Collection Time: 05/24/17 10:26 PM  Result Value Ref Range Status   MRSA, PCR NEGATIVE NEGATIVE Final   Staphylococcus aureus NEGATIVE NEGATIVE Final    Comment: (NOTE) The Xpert SA Assay (FDA approved for NASAL specimens in patients 81 years of age and older), is one component of a comprehensive surveillance program. It is not intended to diagnose infection nor to guide or monitor treatment. Performed at [facility], [address] [place], [place] [address]      Patient was seen and examined on the day of discharge and was found to be in stable condition. Time  coordinating discharge: 35 minutes including assessment and coordination of care, as well as examination of the patient.   SIGNED:  Noralee Stain, DO [facility] Pager [phone]  If 7PM-7AM, please contact night-coverage [email or web address] Password [facility] 06/03/2017, 10:29 AM

## 2017-06-02 NOTE — Progress Notes (Signed)
Notified by RN that patient has been having vomiting after meals. Will cancel DC for today, obtain KUB. Ordered zofran IV prn prior to meal as well as reglan IV.   Meagan StainJennifer Shantia Sanford, DO [facility] [email or web address] Password [email or web address] 06/02/2017, 3:20 PM

## 2017-06-03 LAB — BASIC METABOLIC PANEL
Anion gap: 10 (ref 5–15)
BUN: 41 mg/dL — ABNORMAL HIGH (ref 6–20)
CALCIUM: 8.7 mg/dL — AB (ref 8.9–10.3)
CO2: 29 mmol/L (ref 22–32)
CREATININE: 1.42 mg/dL — AB (ref 0.44–1.00)
Chloride: 103 mmol/L (ref 101–111)
GFR, EST AFRICAN AMERICAN: 35 mL/min — AB (ref >60)
GFR, EST NON AFRICAN AMERICAN: 31 mL/min — AB (ref >60)
Glucose, Bld: 150 mg/dL — ABNORMAL HIGH (ref 65–99)
Potassium: 3.8 mmol/L (ref 3.5–5.1)
Sodium: 142 mmol/L (ref 135–145)

## 2017-06-03 LAB — CBC
HCT: 33.1 % — ABNORMAL LOW (ref 36.0–46.0)
Hemoglobin: 10.4 g/dL — ABNORMAL LOW (ref 12.0–15.0)
MCH: 29.6 pg (ref 26.0–34.0)
MCHC: 31.4 g/dL (ref 30.0–36.0)
MCV: 94.3 fL (ref 78.0–100.0)
PLATELETS: 355 10*3/uL (ref 150–400)
RBC: 3.51 MIL/uL — ABNORMAL LOW (ref 3.87–5.11)
RDW: 15.9 % — AB (ref 11.5–15.5)
WBC: 16.2 10*3/uL — AB (ref 4.0–10.5)

## 2017-06-03 MED ORDER — ONDANSETRON HCL 4 MG PO TABS: 4.0000 mg | ORAL_TABLET | Freq: Three times a day (TID) | ORAL | 0 refills | Status: DC | PRN

## 2017-06-03 MED ORDER — METOCLOPRAMIDE HCL 5 MG PO TABS: 5.0000 mg | ORAL_TABLET | Freq: Three times a day (TID) | ORAL | 0 refills | Status: DC | PRN

## 2017-06-03 MED ORDER — HYDROCHLOROTHIAZIDE 12.5 MG PO CAPS: 12.5000 mg | ORAL_CAPSULE | Freq: Every day | ORAL | 0 refills | Status: DC

## 2017-06-03 MED ORDER — HYDROCHLOROTHIAZIDE 12.5 MG PO CAPS
12.5000 mg | ORAL_CAPSULE | Freq: Every day | ORAL | Status: DC
  Administered 2017-06-03: 12.5 mg via ORAL
  Filled 2017-06-03: qty 1

## 2017-06-03 NOTE — Progress Notes (Signed)
Report called to Amanda at [facility].  

## 2017-06-03 NOTE — Progress Notes (Signed)
Patient discharged to SNF via EMS. Daughters at bedside.

## 2017-06-03 NOTE — Clinical Social Work Placement (Signed)
   CLINICAL SOCIAL WORK PLACEMENT  NOTE 06/03/17 - DISCHARGED TO [facility] VIA AMBULANCE  Date:  06/03/2017  Patient Details  Name: Meagan Osborn MRN: [id number] Date of Birth: [birth year]  Clinical Social Work is seeking post-discharge placement for this patient at the Skilled  Nursing Facility level of care (*CSW will initial, date and re-position this form in  chart as items are completed):  Yes   Patient/family provided with [place] Clinical Social Work Department's list of facilities offering this level of care within the geographic area requested by the patient (or if unable, by the patient's family).  Yes   Patient/family informed of their freedom to choose among providers that offer the needed level of care, that participate in Medicare, Medicaid or managed care program needed by the patient, have an available bed and are willing to accept the patient.  Yes   Patient/family informed of [facility]'s ownership interest in [facility] and [facility], as well as of the fact that they are under no obligation to receive care at these facilities.  PASRR submitted to EDS on 05/28/17     PASRR number received on 05/29/17     Existing PASRR number confirmed on       FL2 transmitted to all facilities in geographic area requested by pt/family on 05/28/17     FL2 transmitted to all facilities within larger geographic area on       Patient informed that his/her managed care company has contracts with or will negotiate with certain facilities, including the following:        Yes   Patient/family informed of bed offers received.  [facility]     Physician recommends and patient chooses bed at      [facility] on 06/03/17.  Patient to be transferred to facility by Ambulance     Patient family notified on 06/03/17 of transfer.  Name of family member notified:  Alma DownsSusan Wingo - daughter;  [phone]     PHYSICIAN      Additional Comment:  06/03/17 - [facility] SN[facility] on 4/22.   _______________________________________________ Cristobal Goldmannrawford, Randolf Sansoucie Bradley, LCSW 06/03/2017, 12:11 PM

## 2017-06-03 NOTE — Progress Notes (Signed)
Physical Therapy Treatment Patient Details Name: Meagan Osborn MRN: [id number] DOB: 09-Feb-1921 Today's Date: 06/03/2017    History of Present Illness Pt is a 84 y.o. female admitted 05/23/17 post-unwitnessed fall at home without LOC, sustaining L humerus fx, L pubic ramus fx and L 6th rib fx. Head CT shows L parietal scalp contusion without underlying skull fx; chronic appearing small vessel ischemia. Now s/p L humerus ORIF 4/15. PMH includes HTN, memory loss, CKD IV, asthma, anxiety, osteoporosis.     PT Comments    Continuing work on functional mobility and activity tolerance;  Slow progress due to pain, but pt participates very well; incontinent of urine when standing, assisted with hygeine; [facility], hopefully today   Follow Up Recommendations  SNF;Supervision/Assistance - 24 hour     Equipment Recommendations  None recommended by PT(defer to SNF)    Recommendations for Other Services       Precautions / Restrictions Precautions Precautions: Fall;Shoulder Type of Shoulder Precautions: L rib fx Shoulder Interventions: Shoulder sling/immobilizer;At all times Precaution Comments: No AROM/PROM L shoulder. Ok for AROM elbow, wrist and hand to tolerance per MD Required Braces or Orthoses: Sling Restrictions LUE Weight Bearing: Non weight bearing LLE Weight Bearing: Weight bearing as tolerated    Mobility  Bed Mobility Overal bed mobility: Needs Assistance Bed Mobility: Supine to Sit     Supine to sit: Max assist     General bed mobility comments: Cues for technique, and to initiate towards less painful R side; Max assist to help Les off of bed and elevate trunk to sit  Transfers Overall transfer level: Needs assistance Equipment used: 2 person hand held assist(bilateral suport at gait belt) Transfers: Sit to/from [employer];Stand Pivot Transfers Sit to Stand: Mod assist;+2 physical assistance Stand pivot transfers: +2 physical assistance;Max  assist       General transfer comment: ModA+2 to assist trunk elevation and cue fully upright posture; L knee instability requiring assist to prevent buckling. Pt able to take few shuffling steps towards chair with L knee buckling, requiring maxA+2 to eventually stand pivot due to fatigue  Ambulation/Gait             General Gait Details: Attempted; extreme difficulty with weight bearing LLE due to pain   Stairs             Wheelchair Mobility    Modified Rankin (Stroke Patients Only)       Balance     Sitting balance-Leahy Scale: Fair       Standing balance-Leahy Scale: Zero                              Cognition Arousal/Alertness: Awake/alert Behavior During Therapy: WFL for tasks assessed/performed Overall Cognitive Status: History of cognitive impairments - at baseline                                        Exercises      General Comments        Pertinent Vitals/Pain Pain Assessment: Faces Faces Pain Scale: Hurts even more Pain Location: L shoulder, LLE Pain Descriptors / Indicators: Sore;Guarding;Grimacing Pain Intervention(s): Monitored during session    Home Living                      Prior Function  PT Goals (current goals can now be found in the care plan section) Acute Rehab PT Goals Patient Stated Goal: Get stronger PT Goal Formulation: With patient/family Time For Goal Achievement: 06/10/17 Potential to Achieve Goals: Good Progress towards PT goals: Progressing toward goals    Frequency    Min 3X/week      PT Plan Current plan remains appropriate    Co-evaluation              AM-PAC PT "6 Clicks" Daily Activity  Outcome Measure  Difficulty turning over in bed (including adjusting bedclothes, sheets and blankets)?: A Lot Difficulty moving from lying on back to sitting on the side of the bed? : Unable Difficulty sitting down on and standing up from a chair with  arms (e.g., wheelchair, bedside commode, etc,.)?: Unable Help needed moving to and from a bed to chair (including a wheelchair)?: Total Help needed walking in hospital room?: Total Help needed climbing 3-5 steps with a railing? : Total 6 Click Score: 7    End of Session Equipment Utilized During Treatment: Gait belt Activity Tolerance: Patient tolerated treatment well;Patient limited by fatigue Patient left: with family/visitor present;Other (comment)(on BSC, moving bowels) Nurse Communication: Mobility status;Other (comment)(pt on [facility] with family in room) PT Visit Diagnosis: Other abnormalities of gait and mobility (R26.89);Muscle weakness (generalized) (M62.81)     Time: 9604-5409 PT Time Calculation (min) (ACUTE ONLY): 23 min  Charges:  $Therapeutic Activity: 23-37 mins                    G Codes:       Meagan Osborn, PT  Acute Rehabilitation Services Pager [phone] Office [phone]    Meagan Osborn 06/03/2017, 1:25 PM

## 2017-06-24 ENCOUNTER — Ambulatory Visit: Payer: Medicare Other | Admitting: Emergency Medicine

## 2017-06-24 ENCOUNTER — Encounter: Payer: Self-pay | Admitting: Emergency Medicine

## 2017-06-24 DIAGNOSIS — R911 Solitary pulmonary nodule: Secondary | ICD-10-CM

## 2017-06-24 DIAGNOSIS — J4521 Mild intermittent asthma with (acute) exacerbation: Secondary | ICD-10-CM

## 2017-06-24 DIAGNOSIS — J302 Other seasonal allergic rhinitis: Secondary | ICD-10-CM

## 2017-06-24 NOTE — Assessment & Plan Note (Signed)
Please continue Symbicort 80/4.5 mcg, 2 puffs twice a day.  Remember to rinse and gargle after using. Keep Xopenex available to use 2 puffs up to every 4 hours if needed for shortness of breath, wheezing, chest tightness. Follow with Dr Delton Coombes in 6 months or sooner if you have any problems

## 2017-06-24 NOTE — Assessment & Plan Note (Signed)
Calcified nodules. No plans to image further at this time.

## 2017-06-24 NOTE — Progress Notes (Signed)
   Subjective:    Patient ID: Meagan Osborn, female    DOB: October 06, 1920, 84 y.o.   MRN: [id number]  Asthma  Her past medical history is significant for asthma.   84 yo female never smoker with long hx of asthma seen for pulmonary consult April 2016 with Dr. Delton Coombes    ROV 06/13/16 -- this follow-up visit for patient with a history of asthma/fixed obstruction, also with allergic rhinitis and cough. She was found to have a suspected lower lobe pulmonary nodule on the lateral chest x-ray from 11/11/15. No subsequent CT scan was done, plans to follow with a chest x-ray.  She has done fairly well since last visit - no significant increase in drainage, taking loratadine reliably. She is on symbicort bid, sometimes skips it. She never uses her xopenex She has lost some wt, about 2-3 lbs over the last couple of weeks.   ROV 12/13/16 --patient has a history of fixed asthma, allergic rhinitis, chronic cough.  Also with scattered calcified granulomas on chest x-ray.  CT scan has not been done to follow these nodules.  She has been managed on Symbicort, Xopenex as needed.  She tells me that she believes that she has slowed down some, is walking a bit less. She is using symbicort, often misses a dose.  Her daughter describes delayed swallowing, holding food medication drink in her mouth before swallowing, no overt choking or coughing  ROV 06/24/17 --follow-up visit for 84 year old woman with a history of fixed asthma, allergic rhinitis and chronic cough.  She has scattered calcified granulomas by chest x-ray that we have decided not to follow with CT chest.  She has some occasional dysphasia but denies any overt coughing or choking. Since last visit she had a fall that resulted in multiple fractures, required L humeral sgy, had a pubic ramus fracture. She has completed enoxaparin prophylaxis. Her breathing has been stable. Has been sensitive to some smells like flowers, etc. She is on loratadine, is unsure that she is  getting her am dose of symbicort. She does not use xopenex frequently.    Review of Systems As per HPI    Objective:   Physical Exam Vitals:   06/24/17 1337 06/24/17 1339  BP:  (!) 166/78  Pulse:  90  SpO2:  97%  Weight: 107 lb (48.5 kg)   Height:  (1.549 m)    Gen: Pleasant, Elderly woman, well-nourished, in no distress,  normal affect  ENT: No lesions,  mouth clear,  oropharynx clear, no postnasal drip  Neck: No JVD, no stridor  Lungs: No use of accessory muscles, clear without rales or rhonchi  Cardiovascular: RRR, heart sounds normal, no murmur or gallops, no peripheral edema  Musculoskeletal: L arm is in a sling.   Neuro: alert, non focal  Skin: Warm, no lesions or rashes     Assessment & Plan:  Pulmonary nodule Calcified nodules. No plans to image further at this time.   Asthma, chronic Please continue Symbicort 80/4.5 mcg, 2 puffs twice a day.  Remember to rinse and gargle after using. Keep Xopenex available to use 2 puffs up to every 4 hours if needed for shortness of breath, wheezing, chest tightness. Follow with Dr Delton Coombes in 6 months or sooner if you have any problems  Allergic rhinitis Continue loratadine 10 mg daily.  Levy Pupa, MD, PhD 06/24/2017, 1:54 PM [facility] [phone] or if no answer [phone]

## 2017-06-24 NOTE — Patient Instructions (Signed)
Please continue Symbicort 80/4.5 mcg, 2 puffs twice a day.  Remember to rinse and gargle after using. Keep Xopenex available to use 2 puffs up to every 4 hours if needed for shortness of breath, wheezing, chest tightness. Continue loratadine 10 mg daily. Follow with Dr Delton Coombes in 6 months or sooner if you have any problems

## 2017-06-24 NOTE — Assessment & Plan Note (Signed)
Continue loratadine 10 mg daily 

## 2017-11-20 ENCOUNTER — Telehealth: Payer: Self-pay | Admitting: Emergency Medicine

## 2017-11-20 MED ORDER — BUDESONIDE-FORMOTEROL FUMARATE 80-4.5 MCG/ACT IN AERO: INHALATION_SPRAY | RESPIRATORY_TRACT | 1 refills | Status: DC

## 2017-11-20 NOTE — Telephone Encounter (Signed)
Spoke with pt's daughter, Darl Pikes. Pt is needing a refill on Symbicort. Rx has been sent in. Nothing further was needed.

## 2017-12-12 ENCOUNTER — Other Ambulatory Visit: Payer: Self-pay | Admitting: Emergency Medicine

## 2017-12-24 ENCOUNTER — Ambulatory Visit: Payer: Medicare Other | Admitting: Emergency Medicine

## 2017-12-24 ENCOUNTER — Encounter: Payer: Self-pay | Admitting: Emergency Medicine

## 2017-12-24 DIAGNOSIS — R911 Solitary pulmonary nodule: Secondary | ICD-10-CM

## 2017-12-24 DIAGNOSIS — J302 Other seasonal allergic rhinitis: Secondary | ICD-10-CM

## 2017-12-24 DIAGNOSIS — J4521 Mild intermittent asthma with (acute) exacerbation: Secondary | ICD-10-CM | POA: Diagnosis not present

## 2017-12-24 MED ORDER — AEROCHAMBER MV MISC: 0 refills | Status: AC

## 2017-12-24 MED ORDER — BUDESONIDE-FORMOTEROL FUMARATE 80-4.5 MCG/ACT IN AERO
INHALATION_SPRAY | RESPIRATORY_TRACT | 3 refills | Status: AC
Start: 2017-12-24 — End: ?

## 2017-12-24 NOTE — Assessment & Plan Note (Signed)
Managing conservatively.  No plans for any CT scan imaging

## 2017-12-24 NOTE — Assessment & Plan Note (Signed)
Overall stable.  She does have some difficulty with Symbicort bothering her throat, causing a bad taste.  We will try using with a spacer see if she prefers.  Reminded her to rinse and gargle after using.  Flu shot and pneumonia shots are both up-to-date.  Continue Symbicort 2 puffs twice a day. We will try to use with a spacer.  Remember to rinse and gargle after using. Keep your Xopenex available to use 2 puffs if needed for shortness of breath, chest tightness, wheezing. Follow with Dr Delton CoombesByrum in 6 months or sooner if you have any problems

## 2017-12-24 NOTE — Progress Notes (Signed)
   Subjective:    Patient ID: Meagan Osborn, female    DOB: 1920-06-27, 84 y.o.   MRN: [id number]  Asthma  Her past medical history is significant for asthma.   84 y.o. female never smoker with long hx of asthma seen for pulmonary consult April 2016 with Dr. Delton CoombesByrum    ROV 06/24/17 --follow-up visit for 84 year old woman with a history of fixed asthma, allergic rhinitis and chronic cough.  She has scattered calcified granulomas by chest x-ray that we have decided not to follow with CT chest.  She has some occasional dysphasia but denies any overt coughing or choking. Since last visit she had a fall that resulted in multiple fractures, required L humeral sgy, had a pubic ramus fracture. She has completed enoxaparin prophylaxis. Her breathing has been stable. Has been sensitive to some smells like flowers, etc. She is on loratadine, is unsure that she is getting her am dose of symbicort. She does not use xopenex frequently.   ROV 12/24/17 --Meagan Osborn is 84 and has a long history of obstructive lung disease with fixed asthma.  She also has chronic cough in the setting of this and allergic rhinitis.  We identified scattered calcified granulomas but chest x-ray but have managed conservatively and deferred CT scan of the chest.  She deals with some chronic cough and has some occasional dysphasia. She is on Symbicort, good compliance. Feels that her breathing has been stable, doesn't seem to be stopping her exertion. Rare xopenex use. Remains on loratadine.  PNA shot up to date.    Review of Systems As per HPI    Objective:   Physical Exam Vitals:   12/24/17 0933  BP: 128/76  Pulse: 80  SpO2: 95%  Weight: 106 lb (48.1 kg)  Height: 5\' 1"  (1.549 m)   Gen: Pleasant, Elderly woman, well-nourished, in no distress,  normal affect  ENT: No lesions,  mouth clear,  oropharynx clear, no postnasal drip  Neck: No JVD, no stridor  Lungs: No use of accessory muscles, clear without rales or  rhonchi  Cardiovascular: RRR, heart sounds normal, no murmur or gallops, no peripheral edema  Musculoskeletal: L arm is in a sling.   Neuro: alert, non focal  Skin: Warm, no lesions or rashes     Assessment & Plan:  Pulmonary nodule Managing conservatively.  No plans for any CT scan imaging  Asthma, chronic Overall stable.  She does have some difficulty with Symbicort bothering her throat, causing a bad taste.  We will try using with a spacer see if she prefers.  Reminded her to rinse and gargle after using.  Flu shot and pneumonia shots are both up-to-date.  Continue Symbicort 2 puffs twice a day. We will try to use with a spacer.  Remember to rinse and gargle after using. Keep your Xopenex available to use 2 puffs if needed for shortness of breath, chest tightness, wheezing. Follow with Dr Delton CoombesByrum in 6 months or sooner if you have any problems  Allergic rhinitis She has a lot of rhinitis every day.  We discussed starting a nasal spray today, she does not tolerate these well and likely will not take.  Instead we will try changing her loratadine to Allegra to see if she gets more benefit from an alternative antihistamine.  We will avoid sedating medicines.  Meagan Pupaobert Alayja Armas, MD, PhD 12/24/2017, 9:56 AM [facility] [phone] or if no answer [phone]

## 2017-12-24 NOTE — Assessment & Plan Note (Signed)
She has a lot of rhinitis every day.  We discussed starting a nasal spray today, she does not tolerate these well and likely will not take.  Instead we will try changing her loratadine to Allegra to see if she gets more benefit from an alternative antihistamine.  We will avoid sedating medicines.

## 2017-12-24 NOTE — Patient Instructions (Addendum)
Try changing your loratadine to Allegra 180mg  daily to see if you prefer it  Continue Symbicort 2 puffs twice a day. We will try to use with a spacer.  Remember to rinse and gargle after using. Keep your Xopenex available to use 2 puffs if needed for shortness of breath, chest tightness, wheezing. Follow with Dr Delton CoombesByrum in 6 months or sooner if you have any problems

## 2017-12-24 NOTE — Addendum Note (Signed)
Addended by: Jaynee EaglesLEMONS, LINDSAY C on: 12/24/2017 10:13 AM   Modules accepted: Orders

## 2018-07-17 ENCOUNTER — Telehealth: Payer: Self-pay | Admitting: Internal Medicine

## 2018-07-17 NOTE — Telephone Encounter (Signed)
Talked with patient's daughter Darl Pikes and have scheduled a Telehealth Palliative Consult for 07/20/18 @ 2:30 PM

## 2018-07-20 ENCOUNTER — Other Ambulatory Visit: Payer: Self-pay

## 2018-07-20 ENCOUNTER — Encounter: Payer: Self-pay | Admitting: Internal Medicine

## 2018-07-20 ENCOUNTER — Other Ambulatory Visit: Payer: Medicare Other | Admitting: Internal Medicine

## 2018-07-20 DIAGNOSIS — Z515 Encounter for palliative care: Secondary | ICD-10-CM

## 2018-07-20 NOTE — Progress Notes (Signed)
June 8th, 2020 [facility] Consult Note Telephone: [phone](336) [phone]  Fax: [phone]([phone]  *Due to the current COVID-19 infection/crises, the patient and family prefer, and have given their verbal consent for, a provider visit via telehealth, from my office. HIPPA policies of confidentially were discussed.  PATIENT NAME: Meagan Osborn DOB: May 12, 1920 MRN: [id number]  Home: [address] [place] [id number] Phone: [phone]/home or [phone]/cell  PRIMARY CARE PROVIDER:   Barbie BannerWilson, Fred H, MD [address] [place], [place] [id number] Dr. Levy Pupaobert Byrum [facility]([place])  REFERRING PROVIDER: Dr. Bufford ButtnerElizabeth Upton (Nephrology)  RESPONSIBLE PARTY:   (dtr) Alma DownsSusan Wingo [facility](M)  [phone],  Email address:  [email or web address].   ASSESSMENT:      (06/05/2018) Weight: 10/2017: 102 lbs. Height 5'1"  RECOMMENDATIONS and PLAN:  1. Cognitive / Functional decline r/t dementia / age / deconditioning: FAST 6b. Patient is oriented to self and place, confused as to time/seasons. Decreased vision d/t Fuchs dystrophy. She spends most of her time in bed, frequent daytime naps. She is awake about 6 / 24 hrs. Tends to keep to her room and needs a lot of encouragement to join the family anywhere else in the home. Recognizes family members but no longer her health care providers. She no longer can do puzzles that she could do 6 months ago. She is increasingly anxious with change in routines, or planned trips outside the home (like office visits) such that she will have emesis. Past episodic vertigo that improved with treatments at vestibular rehab. She ambulates about in her room to the bathroom, using the walker for steadiness. She is independent with transfers and with dressing, but needs cuing for personal care. Daughter assists with weekly bath, but patient very resistant. About 14 months ago patient was able to ambulate without assistive devices and could make/cook her own  meals. Last weight 06/05/2018 was 102 lb. At height of 5'1" her BMI is 19.9 kg/m2. PCG daughter [name] believes patient looks a little thinner in the face and arms. Patient consumes many snacks, and about 50% of soft textured meals.She is able to feed herself but has been pocketing her food/fluid and needs cuing to swallow. She struggles with swallowing her pills and they need to be crushed. Past ST evaluation revealed no mechanical or structural abnormality; dysfunction thought r/t progression in dementia. She appears to be a high aspiration risk. She is continent of bowel and bladder, with occasional dribbling. Utilizes depends. [name] reports some "suspicious" skin lesions on her upper forearm; no plans to pursue diagnosis. Steri Lid wash bid for blepharitis. She has had no falls since April of last year. At that time she had a nasty fall with left humerus, rib, and pelvic fractures; subsequent lengthy hospitalization and rehab stay. She never recovered back to her baseline.   2. Symptom Management: Anxiety with changes in routine and leaving the home. On Zoloft. Daughter hoping to find a provider who can make house calls. -I provided susan with a list of providers who will visit the home.  3. Family Supports/Coping: After the death of her husband in 2010, patient went to reside with her daughter Avon Gully(HCPOA) [name], and Susan's husband. [name] works out of the home, and feels stress of her care giving responsibilities. [name] is hesitant to hire outside help (strangers in the home). Another daughter is basically uninvolved in patient's care.   4. Advance Care Directives: We spent a bit of time discussing, and completed, the MOST form. [name] has a copy that  had been given to her by her PCP, and she has been reviewing. Details: DNR/DNI. Scope of Medical Interventions: Comfort Measures. Antibiotics: Determine use or limitations at the time of infection. IVFs: for a defined trial period. No feeding tube. MOST uploaded  into [facility] EPIC EMR/VYNCR. I also completed and will mail to [name] 2 DNR forms; one to place of fridge and the other to take with her when patient travels outside the home. I also enclosed companion educational material regarding these directives  5. Goals of Care (per PCG daughter [name]): For patient to be comfortable and safe. [name] is aware of the progressive nature of dementia, with gradual cognitive and physical decline. [name] would be open to hospice evaluation should patient meet criteria in the future.   6. Follow up: will call to schedule f/u in 4-6 weeks; monitor for further decline/? Hospice eligibility.   I spent 90 minutes providing this consultation,  from 2 pm to 3:30 pm. More than 50% of the time in this consultation was spent coordinating communication.   HISTORY OF PRESENT ILLNESS:  Meagan Osborn is a 85 y.o.  female with h/o dementia, asthma, allergic rhinitis, chronic cough, falls with L fumeral and pubic fx 05/2018, CKD stage 3 (not a dialysis candidate d/t age/famly wishes. 06/04/18 Creatine 1.42/Bun 41), benign positional vertigo, anemia, and HTN. Palliative Care was asked to help address goals of care.   CODE STATUS:   PPS: 0% HOSPICE ELIGIBILITY/DIAGNOSIS: TBD  PAST MEDICAL HISTORY:  Past Medical History:  Diagnosis Date   Anxiety    Arthritis    Asthma    Closed fracture of left proximal humerus 05/23/2017   Hypertension    Hypothyroidism    Left rib fracture 05/23/2017   Macular degeneration    Osteoporosis    Pubic ramus fracture, left, closed, initial encounter (HCC) 05/23/2017   Thyroid disease     SOCIAL HX:  Social History   Tobacco Use   Smoking status: Never Smoker   Smokeless tobacco: Never Used  Substance Use Topics   Alcohol use: No    ALLERGIES:  Allergies  Allergen Reactions   Sulfa Antibiotics Other (See Comments)    unknown     PERTINENT MEDICATIONS:  Outpatient Encounter Medications as of 07/20/2018  Medication Sig    acetaminophen (TYLENOL) 160 MG/5ML solution Take 20.3 mLs (650 mg total) by mouth every 6 (six) hours.   amLODipine (NORVASC) 10 MG tablet Take 1 tablet (10 mg total) by mouth daily.   budesonide-formoterol (SYMBICORT) 80-4.5 MCG/ACT inhaler INHALE 2 PUFFS INTO THE LUNGS TWICE A DAY   levalbuterol (XOPENEX HFA) 45 MCG/ACT inhaler Inhale 2 puffs into the lungs every 4 (four) hours as needed for wheezing or shortness of breath.   levothyroxine (SYNTHROID, LEVOTHROID) 50 MCG tablet Take 50 mcg by mouth daily before breakfast.   loratadine (CLARITIN) 10 MG tablet Take 10 mg by mouth daily.   sertraline (ZOLOFT) 25 MG tablet Take 25 mg by mouth daily.   Spacer/Aero-Holding Chambers (AEROCHAMBER MV) inhaler Use as instructed   [DISCONTINUED] hydrochlorothiazide (MICROZIDE) 12.5 MG capsule Take 1 capsule (12.5 mg total) by mouth daily.   [DISCONTINUED] metoprolol tartrate (LOPRESSOR) 25 MG tablet Take 1 tablet (25 mg total) by mouth 2 (two) times daily.   [DISCONTINUED] ondansetron (ZOFRAN) 4 MG tablet Take 1 tablet (4 mg total) by mouth every 8 (eight) hours as needed for nausea or vomiting.   No facility-administered encounter medications on file as of 07/20/2018.  PHYSICAL EXAM:   Limited physical exam d/t telehealth nature of visit. In general patient is a slender, elderly female in no distress. She is alert and pleasantly conversant, but went to bed/slept early in our visit. Daughter Manuela Schwartz was present and primary source of information. Patient was see ambulating with walker; steady. She denied pain. No dyspnea with her short walk. Neuro exam: generalized weakness but  grossly non-focal. No rashes on exposed skin.  Julianne Handler, NP

## 2018-08-25 ENCOUNTER — Other Ambulatory Visit: Payer: Self-pay

## 2018-08-25 ENCOUNTER — Other Ambulatory Visit: Payer: Medicare Other | Admitting: Internal Medicine

## 2018-08-25 ENCOUNTER — Encounter: Payer: Self-pay | Admitting: Internal Medicine

## 2018-08-25 DIAGNOSIS — Z515 Encounter for palliative care: Secondary | ICD-10-CM

## 2018-08-25 NOTE — Progress Notes (Signed)
Juy 14th, 2020 [facility] Consult Note Telephone: [phone]  Fax: [phone]  Due to the current COVID-19 infection/crises, the family prefer, and have given their verbal consent for, a provider visit via telemedicine. HIPPA policies of confidentially were discussed. Video-audio (telehealth) contact was unable to be done due technical barriers from the patients side.  PATIENT NAME: Meagan Osborn DOB: Oct 09, 1920 MRN: [id number] Home: [address] [place] Phone: [phone]/home or [phone]/BMWU  PRIMARY CARE PROVIDER:   Christain [place], MD [address], [place] [address] Dr. Baltazar Apo [facility])  REFERRING PROVIDER: Dr. Madelon Lips (Nephrology)  RESPONSIBLE PARTY:   (dtr) [name] [facility])  [phone], Email address: [email or web address].  ASSESSMENT/RECOMMENDATIONS: 1.Progressive functional and cognitive decline:  FAST 6b. Daughter reports patient is quieter; not initiating conversation. She has increased somnolence and is awake about 4 hours/day. She shows no interest in her usual activities of watching TV. Her speech is clear but off topic; wandering convoluted reasoning. She recognizes her daughter and grandchildren. Oriented to self and place. She is weaker, and having more difficulty with ambulating with her walker. She struggles now when transferring. Daughter notes increasingly winded when ambulating short distances. She typically has high anxiety with travels outside the home, and daughter is not looking forward to traveling with patient to visit her PCP tomorrow. Patient is argumentative and very resistant to personal care; daughter really struggle to help patient wash. She is dependent for hygiene and dressing. She is mostly incontinent of urine. She increasingly pockets her food; needs reminding to swallow. Daughter needs to crush patients pills, and patient sometimes spits them out.  Decreased appetite; nibbles food during the day. No recent weights but daughter doesnt think patient shows weight loss. Last weight 4/24/202 102lbs, Height 5'1". F/u visit with PCP tomorrow   -Consider initiation of Depakote sprinkles for mood stabilization, 125mg  bid.  -Consider increase Zoloft (for anxiety)  2. Advance Care Directives:  DNR/DNI. S cope of Medical Interventions: Comfort Measures. Antibiotics: Determine use or limitations at the time of infection. IVFs: for a defined trial period. No feeding tube.   3. Goals of Care (per PCG daughter Manuela Schwartz): For patient to be comfortable and safe. Manuela Schwartz is aware of the progressive nature of dementia, with gradual cognitive and physical decline. Manuela Schwartz would be open to hospice evaluation should patient meet criteria in the future.   4. Follow up: Monday Aug 10th noon. Monitor for hospice eligibility/symptom management.  I spent 30 minutes providing this consultation,  from 3:30pm to 4pm. More than 50% of the time in this consultation was spent coordinating communication.   HISTORY OF PRESENT ILLNESS:  Meagan Osborn is a 85 y.o.  female with h/o dementia, asthma, allergic rhinitis, chronic cough, falls with L fumeral and pubic fx 05/2018, CKD stage 3 (not a dialysis candidate d/t age/famly wishes. 06/04/18 Creatine 1.42/Bun 41), benign positional vertigo, anemia, and HTN. Palliative Care was asked to help address goals of care.  CODE STATUS:  DNR/DNI. S cope of Medical Interventions: Comfort Measures. Antibiotics: Determine use or limitations at the time of infection. IVFs: for a defined trial period. No feeding tube.   PPS: weak 40%  HOSPICE ELIGIBILITY/DIAGNOSIS: TBD  PAST MEDICAL HISTORY:  Past Medical History:  Diagnosis Date   Anxiety    Arthritis    Asthma    Closed fracture of left proximal humerus 05/23/2017   Hypertension    Hypothyroidism    Left rib fracture 05/23/2017  Macular degeneration    Osteoporosis    Pubic  ramus fracture, left, closed, initial encounter (HCC) 05/23/2017   Thyroid disease     SOCIAL HX:  Social History   Tobacco Use   Smoking status: Never Smoker   Smokeless tobacco: Never Used  Substance Use Topics   Alcohol use: No    ALLERGIES:  Allergies  Allergen Reactions   Sulfa Antibiotics Other (See Comments)    unknown     PERTINENT MEDICATIONS:  Outpatient Encounter Medications as of 08/25/2018  Medication Sig   acetaminophen (TYLENOL) 160 MG/5ML solution Take 20.3 mLs (650 mg total) by mouth every 6 (six) hours.   amLODipine (NORVASC) 10 MG tablet Take 1 tablet (10 mg total) by mouth daily.   budesonide-formoterol (SYMBICORT) 80-4.5 MCG/ACT inhaler INHALE 2 PUFFS INTO THE LUNGS TWICE A DAY   levalbuterol (XOPENEX HFA) 45 MCG/ACT inhaler Inhale 2 puffs into the lungs every 4 (four) hours as needed for wheezing or shortness of breath.   levothyroxine (SYNTHROID, LEVOTHROID) 50 MCG tablet Take 50 mcg by mouth daily before breakfast.   loratadine (CLARITIN) 10 MG tablet Take 10 mg by mouth daily.   sertraline (ZOLOFT) 25 MG tablet Take 25 mg by mouth daily.   Spacer/Aero-Holding Chambers (AEROCHAMBER MV) inhaler Use as instructed   No facility-administered encounter medications on file as of 08/25/2018.     PHYSICAL EXAM:   PE deferred d/t telehealth audio nature of visit.  Anselm LisMary P Florestine Carmical, NP

## 2018-09-21 ENCOUNTER — Other Ambulatory Visit: Payer: Medicare Other | Admitting: Internal Medicine

## 2018-09-21 ENCOUNTER — Other Ambulatory Visit: Payer: Self-pay

## 2018-09-21 ENCOUNTER — Encounter: Payer: Self-pay | Admitting: Internal Medicine

## 2018-09-21 DIAGNOSIS — Z515 Encounter for palliative care: Secondary | ICD-10-CM

## 2018-09-21 NOTE — Progress Notes (Signed)
Aug 10th, 2020 [facility] Consult Note Telephone: [phone]  Fax: [phone]   Due to the current COVID-19 infection/crises, the family prefer, and have given their verbal consent for, a provider visit via telemedicine. HIPPA policies of confidentially were discussed. Video-audio (telehealth) contact was unable to be done due technical barriers from the patients side.   PATIENT NAME: Meagan Osborn DOB: 1921/02/10 MRN: [id number] Home: [address] [place] Phone: [phone]/home or [phone]/UYQI   PRIMARY CARE PROVIDER:  Bing Matter PA-C [place] ([place] 08/26/18) Christain [place], MD [address] [place] [address] Dr. Baltazar Apo [facility])   REFERRING PROVIDER:  Dr. Madelon Lips (Nephrology)   RESPONSIBLE PARTY: (dtr) [name] [facility])  [phone],  Email address:  [email or web address].   ASSESSMENT / RECOMMENDATIONS: 1.Advance Care Planning:   A. Advance Care Directives: DNR/DNI. Scope of Medical Interventions: Comfort Measures. Antibiotics: Determine use or limitations at the time of infection. IVFs: for a defined trial period. No feeding tube.     B. Goals of Care (per PCG daughter Manuela Schwartz): For patient to be comfortable and safe. Manuela Schwartz is aware of the progressive nature of dementia, with gradual cognitive and physical decline. Manuela Schwartz would be open to hospice evaluation should patient meet criteria in the future.   2. Symptom management: A. High anxiety with outside home travel (symptoms include vomiting) and resistant to personal care (refuses showers (day 15 without); body odor increasing). OV with PCP last month; Zoloft increased to 50mg  qd (form 25mg ) and initiated Depokote sprinkles 125mg  bid. Daughter and PCG Manuela Schwartz is starting this slowly; first with increase of Zoloft, then about 10 days ago started Depokote qam. Notes patient seems to have  increased clarity and is awake more during the day (now 6-8 hours vs 4 hours previously). Still resistant to personal care. Manuela Schwartz is going to increase the Depokote to bid, and watch for positive results.  -We discussed trying to provide some perineal personal care at one time, then focus on under arm and under breast another rather than trying to tackle a whole shower.   3. Progressive functional and cognitive decline:  FAST 6b. Low interest in her usual activities of watching TV. Her speech is clear but off topic; wandering convoluted reasoning. She recognizes her daughter and grandchildren. Oriented to self and place. She is weaker and having more difficulty with ambulating with her walker. Struggles now when transferring. Winded when ambulating short distances. She is dependent for hygiene and dressing. She is mostly incontinent of urine. She increasingly pockets her food; needs reminding to swallow. Daughter needs to crush patients pills, and patient sometimes spits them out. Poor appetite; nibbles food during the day. Last weight at office of PCP was 110 (increase of 8 lbs). At height of 5'1" her BMI is 20.8 kg/m2.  4. Family supports: Patient resides with 24/7 care by her daughter. Grand daughter lives in [place]; is available some days to assist.    5. Follow up: Monday Sept 14th @ noon. Monitor for hospice eligibility/symptom management.  I spent 30 minutes providing this consultation from noon to 12:30 pm. More than 50% of the time in this consultation was spent coordinating communication.    HISTORY OF PRESENT ILLNESS:  Caroleena P Collett is a 85 y.o. female with h/o dementia, asthma, allergic rhinitis, chronic cough, falls with L femeral and pubic fx 05/2018, CKD stage 4 (not a dialysis candidate d/t age/family wishes. GRF 15-29 mg/min),  benign positional vertigo, anemia, and HTN. This is a f/u Palliative Care visit from 08/25/2018.   CODE STATUS:  DNR/DNI. S cope of Medical Interventions:  Comfort Measures. Antibiotics: Determine use or limitations at the time of infection. IVFs: for a defined trial period. No feeding tube.    PPS: weak 40%   HOSPICE ELIGIBILITY/DIAGNOSIS: TBD  PAST MEDICAL HISTORY:  Past Medical History:  Diagnosis Date   Anxiety    Arthritis    Asthma    Closed fracture of left proximal humerus 05/23/2017   Hypertension    Hypothyroidism    Left rib fracture 05/23/2017   Macular degeneration    Osteoporosis    Pubic ramus fracture, left, closed, initial encounter (HCC) 05/23/2017   Thyroid disease     SOCIAL HX:  Social History   Tobacco Use   Smoking status: Never Smoker   Smokeless tobacco: Never Used  Substance Use Topics   Alcohol use: No    ALLERGIES:  Allergies  Allergen Reactions   Sulfa Antibiotics Other (See Comments)    unknown     PERTINENT MEDICATIONS:  Outpatient Encounter Medications as of 09/21/2018  Medication Sig   acetaminophen (TYLENOL) 160 MG/5ML solution Take 20.3 mLs (650 mg total) by mouth every 6 (six) hours.   amLODipine (NORVASC) 10 MG tablet Take 1 tablet (10 mg total) by mouth daily.   budesonide-formoterol (SYMBICORT) 80-4.5 MCG/ACT inhaler INHALE 2 PUFFS INTO THE LUNGS TWICE A DAY   levalbuterol (XOPENEX HFA) 45 MCG/ACT inhaler Inhale 2 puffs into the lungs every 4 (four) hours as needed for wheezing or shortness of breath.   levothyroxine (SYNTHROID, LEVOTHROID) 50 MCG tablet Take 50 mcg by mouth daily before breakfast.   loratadine (CLARITIN) 10 MG tablet Take 10 mg by mouth daily.   sertraline (ZOLOFT) 25 MG tablet Take 25 mg by mouth daily.   Spacer/Aero-Holding Chambers (AEROCHAMBER MV) inhaler Use as instructed   No facility-administered encounter medications on file as of 09/21/2018.     PHYSICAL EXAM:   PE deferred d/t telephonic nature of visit.  Anselm LisMary P Chaylee Ehrsam, NP

## 2018-10-26 ENCOUNTER — Encounter: Payer: Self-pay | Admitting: Internal Medicine

## 2018-10-26 ENCOUNTER — Other Ambulatory Visit: Payer: Self-pay

## 2018-10-26 ENCOUNTER — Other Ambulatory Visit: Payer: Medicare Other | Admitting: Internal Medicine

## 2018-10-26 DIAGNOSIS — Z515 Encounter for palliative care: Secondary | ICD-10-CM

## 2018-10-26 NOTE — Progress Notes (Signed)
Sept 14th, 2020 [facility] Consult Note Telephone: [phone]  Fax: [phone]   Due to the current COVID-19 infection/crises, the family prefer, and have given their verbal consent for, a provider visit via telemedicine. HIPPA policies of confidentially were discussed. Video-audio (telehealth) contact was unable to be done due technical barriers from the patients side.   PATIENT NAME: Meagan Osborn DOB: 07-11-1920 MRN: [id number] Home: [address] [place] [id number] Phone: [phone]/home or [phone]/cell   PRIMARY CARE PROVIDER:  Mady GemmaKristen Kaplan PA-C [facility] (LOV 08/26/18) Meagan Osborn, Meagan Osborn [address] [place], [place] [id number] Dr. Levy Pupaobert Osborn [facility]   REFERRING PROVIDER:  Dr. Bufford ButtnerElizabeth Osborn (Nephrology)   RESPONSIBLE PARTY: (dtr) Meagan DownsSusan Osborn [facility](M) [phone],  Email address:  [email or web address].   ASSESSMENT / RECOMMENDATIONS: 1.Advance Care Planning:   A. Advance Care Directives: DNR/DNI. Scope of Medical Interventions: Comfort Measures. Antibiotics: Determine use or limitations at the time of infection. IVFs: for a defined trial period. No feeding tube.                B. Goals of Care (per PCG daughter Meagan Osborn): For patient to be comfortable and safe. Meagan Osborn is aware of the progressive nature of dementia, with gradual cognitive and physical decline. Meagan Osborn would be open to hospice evaluation should patient meet criteria in the future.    2. Symptom management: Patient continues a slow progressive cognitive and functional decline. PCG daughter Meagan Osborn reports patient with progression of short-term memory loss (unable to recollect her sister recently deceased). Patient is increasingly lethargic and apathetic. Spending most of the day in bed. She is awake only 6 / 24 h. Patient was trialed on Depakote sprinkles (for anxiety/resistance to personal care) but this was  discontinued d/t side effects of increased lethargy. Though patient can still self-transfer, its becoming a bigger struggle. She is no longer able to ambulate d/t weakness and dyspnea; daughter now utilizing a wheelchair to bring her back and forth to the bathroom. No recent weights. Decreased appetite; consumes 50-75% of breakfast, few bites for lunch, and just small amounts of snacks rest of the day. She is increasingly pocketing her food and fluid, and frequently needs reminders to swallow. No recent weights; last weight at office of PCP few months earlier was 110. At height of 5'1" her BMI was 20.8 kg/m2. She has a f/u visit Oct 1st with Dr. Signe Osborn.   4. Family supports: Patient resides with 24/7 care by her daughter. Grand daughter lives in [place]; is available some days to assist.     5. Follow up: Need to schedule; dropped call.   I spent 30 minutes providing this consultation from noon to 12:30 pm. More than 50% of the time in this consultation was spent coordinating communication.    HISTORY OF PRESENT ILLNESS:  Meagan Osborn is a 85 y.o. female with h/o dementia, asthma, allergic rhinitis, chronic cough, falls with L femeral and pubic fx 05/2018, CKD stage 4 (not a dialysis candidate d/t age/family wishes. GRF 15-29 mg/min), benign positional vertigo, anemia, and HTN. This is a f/u Palliative Care visit from 09/21/2018.   CODE STATUS:  DNR/DNI. S cope of Medical Interventions: Comfort Measures. Antibiotics: Determine use or limitations at the time of infection. IVFs: for a defined trial period. No feeding tube.    PPS: weak 40%   HOSPICE ELIGIBILITY/DIAGNOSIS: TBD  PAST MEDICAL HISTORY:  Past Medical History:  Diagnosis Date   Anxiety  Arthritis    Asthma    Closed fracture of left proximal humerus 05/23/2017   Hypertension    Hypothyroidism    Left rib fracture 05/23/2017   Macular degeneration    Osteoporosis    Pubic ramus fracture, left, closed, initial  encounter ([place]) 05/23/2017   Thyroid disease     SOCIAL HX:  Social History   Tobacco Use   Smoking status: Never Smoker   Smokeless tobacco: Never Used  Substance Use Topics   Alcohol use: No    ALLERGIES:  Allergies  Allergen Reactions   Sulfa Antibiotics Other (See Comments)    unknown     PERTINENT MEDICATIONS:  Outpatient Encounter Medications as of 10/26/2018  Medication Sig   acetaminophen (TYLENOL) 160 MG/5ML solution Take 20.3 mLs (650 mg total) by mouth every 6 (six) hours.   amLODipine (NORVASC) 10 MG tablet Take 1 tablet (10 mg total) by mouth daily.   budesonide-formoterol (SYMBICORT) 80-4.5 MCG/ACT inhaler INHALE 2 PUFFS INTO THE LUNGS TWICE A DAY   divalproex (DEPAKOTE SPRINKLE) 125 MG capsule Take 125 mg by mouth 2 (two) times daily.   levalbuterol (XOPENEX HFA) 45 MCG/ACT inhaler Inhale 2 puffs into the lungs every 4 (four) hours as needed for wheezing or shortness of breath.   levothyroxine (SYNTHROID, LEVOTHROID) 50 MCG tablet Take 50 mcg by mouth daily before breakfast.   loratadine (CLARITIN) 10 MG tablet Take 10 mg by mouth daily.   sertraline (ZOLOFT) 50 MG tablet Take 50 mg by mouth daily.    Spacer/Aero-Holding Chambers (AEROCHAMBER MV) inhaler Use as instructed   No facility-administered encounter medications on file as of 10/26/2018.     PHYSICAL EXAM:   PE deferred d/t telephonic nature of visit.  Julianne Handler, NP

## 2019-05-13 DEATH — deceased

## 2019-11-21 IMAGING — CT CT CERVICAL SPINE W/O CM
3 of 5 series · 15 of 33 positions shown, 17 images · non-contrast
Comparison: None.

CLINICAL DATA: Pain after head trauma.

EXAM:
CT HEAD WITHOUT CONTRAST
CT CERVICAL SPINE WITHOUT CONTRAST
TECHNIQUE: Multidetector CT imaging of the head and cervical spine was
performed following the standard protocol without intravenous
contrast. Multiplanar CT image reconstructions of the cervical spine
were also generated.

[Series 6: coronal soft tissue · coronal · 0.31mm/px · 3 of 66 slices shown]
[im 14/66  bone]
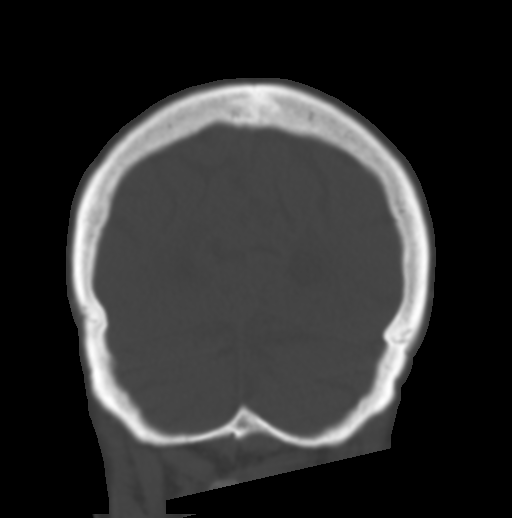
[im 27/66  bone]
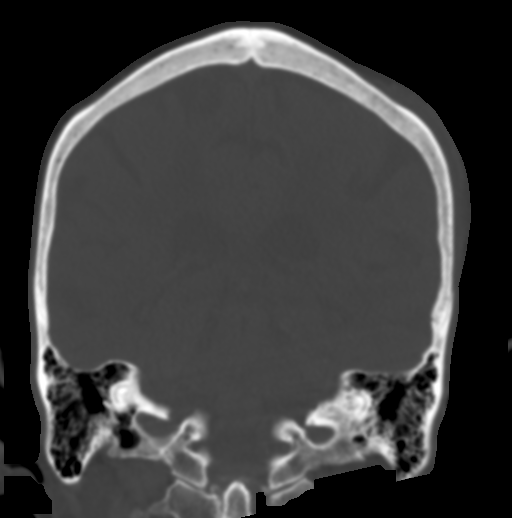
[im 40/66  bone]
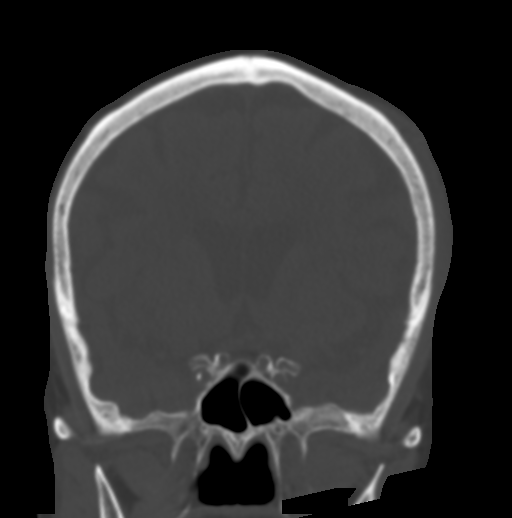

[Series 9: c spine soft · axial · 0.37mm/px · z∈[+1340,+1462]mm · 7 of 79 slices shown, 9 images]
[im 9/79  soft-tissue]
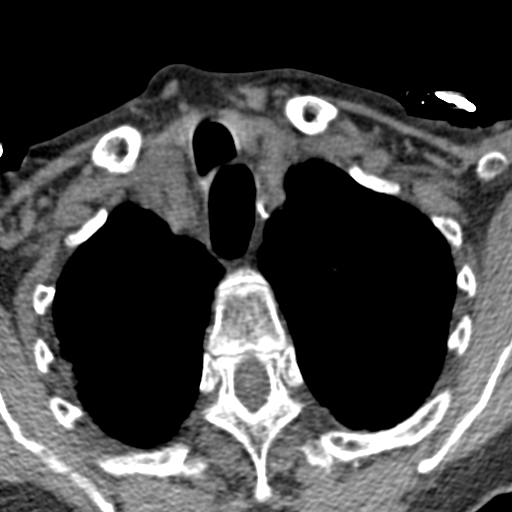
[im 9/79  bone]
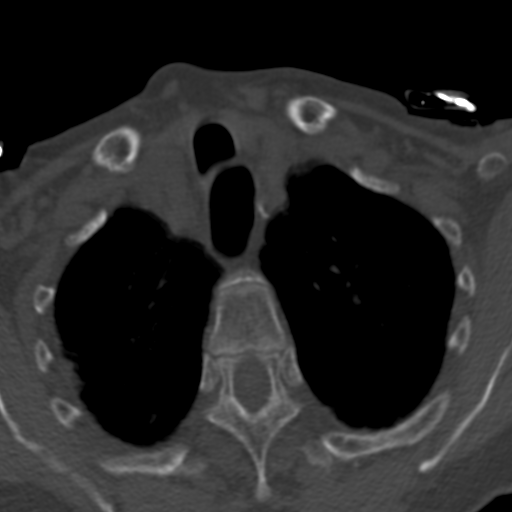
[im 18/79  bone]
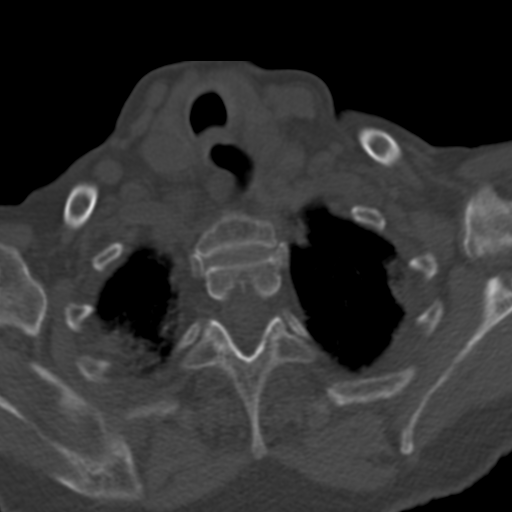
[im 27/79  bone]
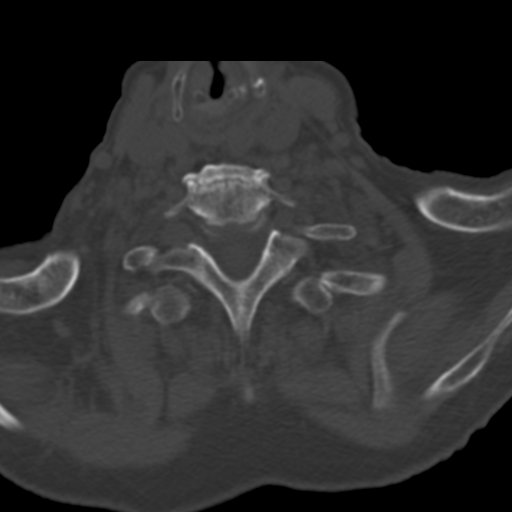
[im 44/79  bone]
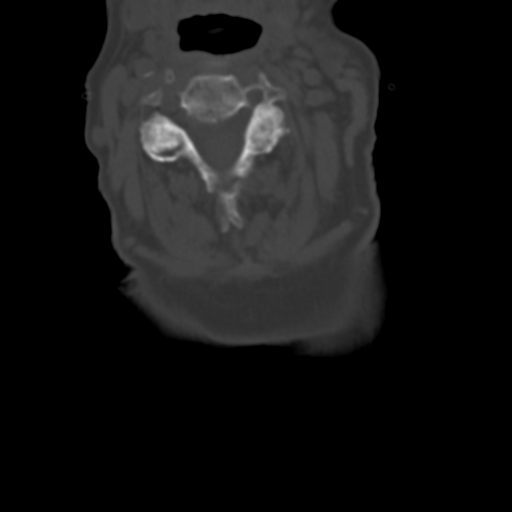
[im 53/79  soft-tissue]
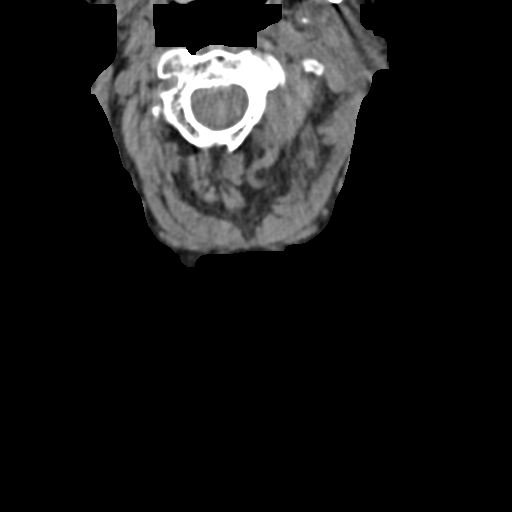
[im 53/79  bone]
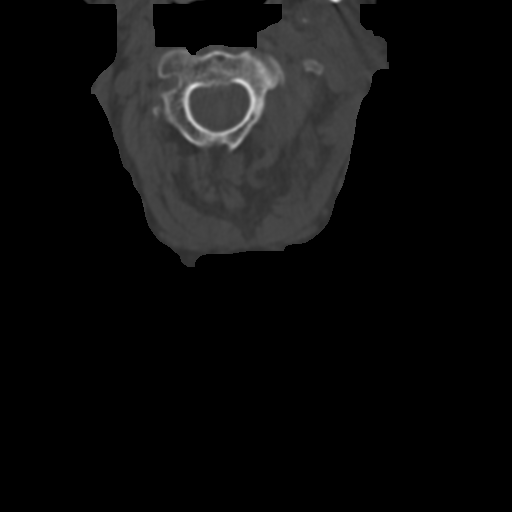
[im 61/79  bone]
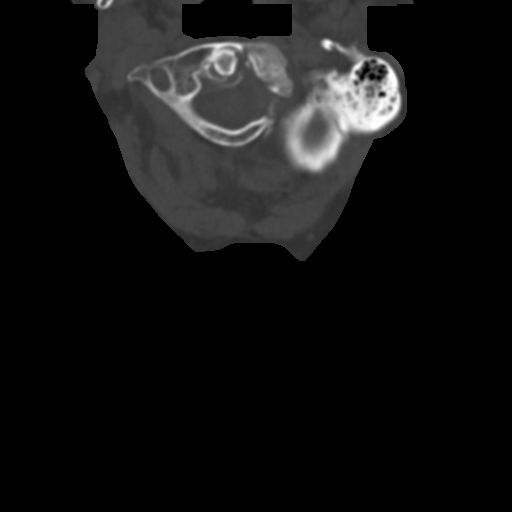
[im 70/79  bone]
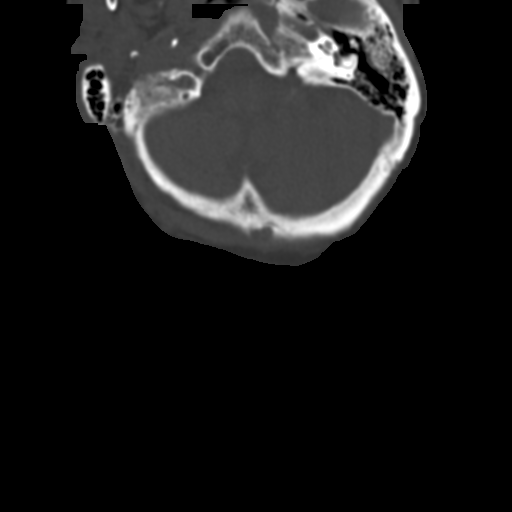

[Series 13: sagittal bone · sagittal · 0.36mm/px · 5 of 58 slices shown]
[im 10/58  bone]
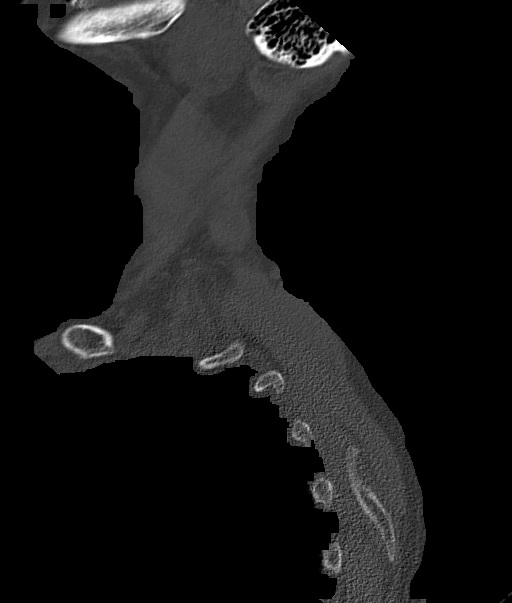
[im 20/58  bone]
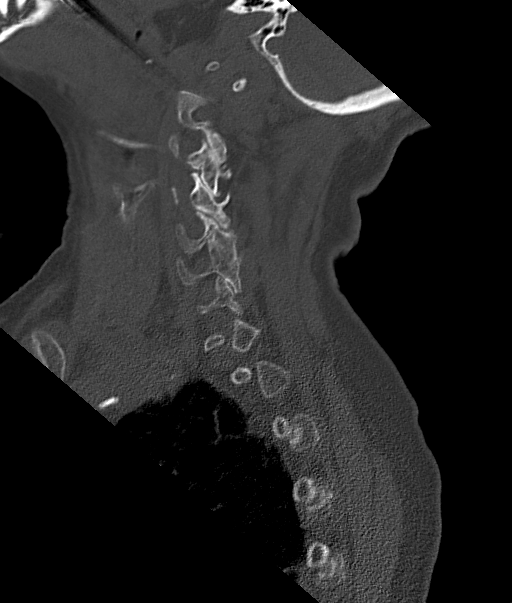
[im 29/58  bone]
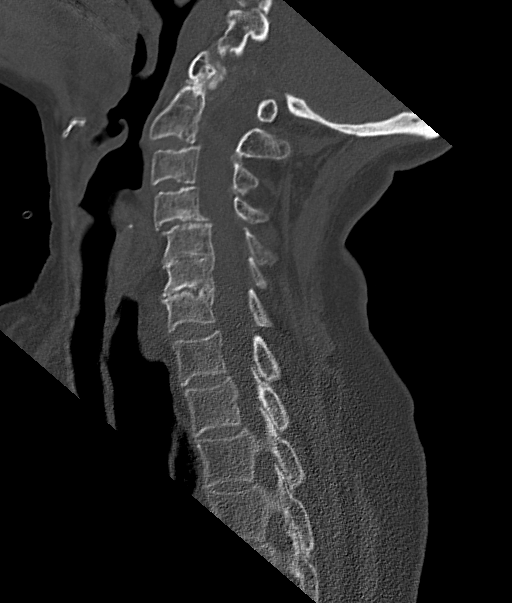
[im 39/58  bone]
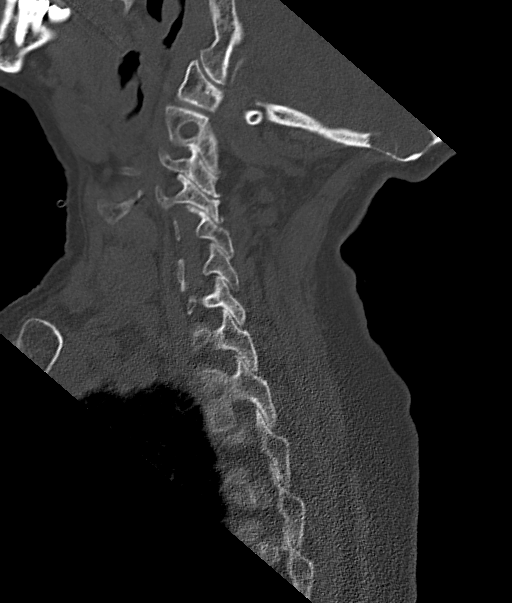
[im 48/58  bone]
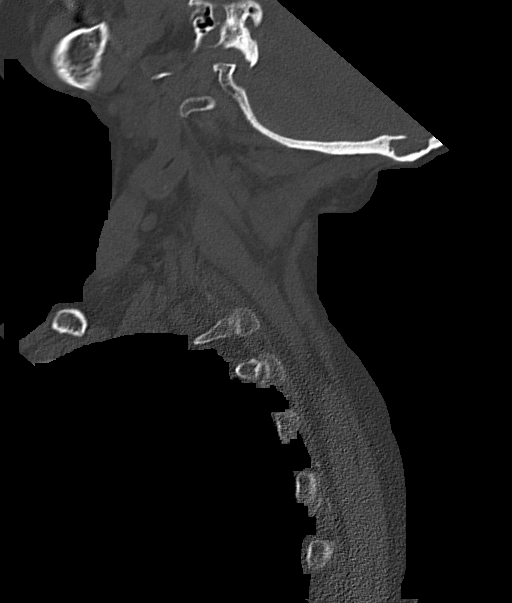

[15 of 33 positions shown; findings below may reference images not displayed]

FINDINGS: CT HEAD FINDINGS

Brain: Moderate chronic appearing small vessel ischemia with
atrophy. Large vascular territory infarct, intra-axial mass nor
extra-axial fluid collections. Midline fourth ventricle and basal
cisterns. No effacement.

Vascular: Atherosclerosis of the carotid siphons. No hyperdense
vessel sign.

Skull: No skull fracture or suspicious osseous lesions. Arachnoid
granulations along the occiput of the skull.

Sinuses/Orbits: Clear mastoids. Bilateral lens replacements. Ethmoid
and sphenoid sinus mucosal thickening without air-fluid levels.

Other: Left parietal scalp contusion.

CT CERVICAL SPINE FINDINGS

Alignment: Degenerative disc and facet mediated grade 1
anterolisthesis of C4 on C5 with marked disc space narrowing. Intact
craniocervical relationship and atlantodental interval.

Skull base and vertebrae: Marked osteoarthritis of the atlantodental
interval with sclerosis and joint space narrowing. No skull base nor
cervical spine fracture. Degenerative subcortical cystic change of
the odontoid process.

Soft tissues and spinal canal: No prevertebral soft tissue swelling.
No visible canal hematoma.

Disc levels: Cervical spondylitic change most pronounced from C4
through C7. Lesser degree of disc space narrowing C2-3 and C3-4.
Multilevel degenerative facet arthropathy, ankylosed at C5-6 on the
right. Uncovertebral joint osteoarthritis from C3-4 on the right and
bilaterally at C4-5, C5-6 and C6-7 contributing to mild foraminal
encroachment at C5-6 and C6-7 bilaterally.

Upper chest: Biapical pleuroparenchymal scarring.

Other: Extracranial carotid arteriosclerosis bilaterally.
IMPRESSION: 1. Left parietal scalp contusion without underlying skull fracture.
2. Cerebral atrophy with chronic appearing small vessel ischemia.
3. Cervical spondylosis without acute cervical spine fracture.
4. Grade 1 anterolisthesis of C4 on C5 mediated by degenerative disc
and facet arthropathy.

## 2019-11-21 IMAGING — CR DG HIP (WITH OR WITHOUT PELVIS) 5+V BILAT
5 series · 5 of 5 positions shown · non-contrast
Comparison: None.

CLINICAL DATA: Fall

EXAM:
DG HIP (WITH OR WITHOUT PELVIS) 5+V BILAT

[x pelvis]
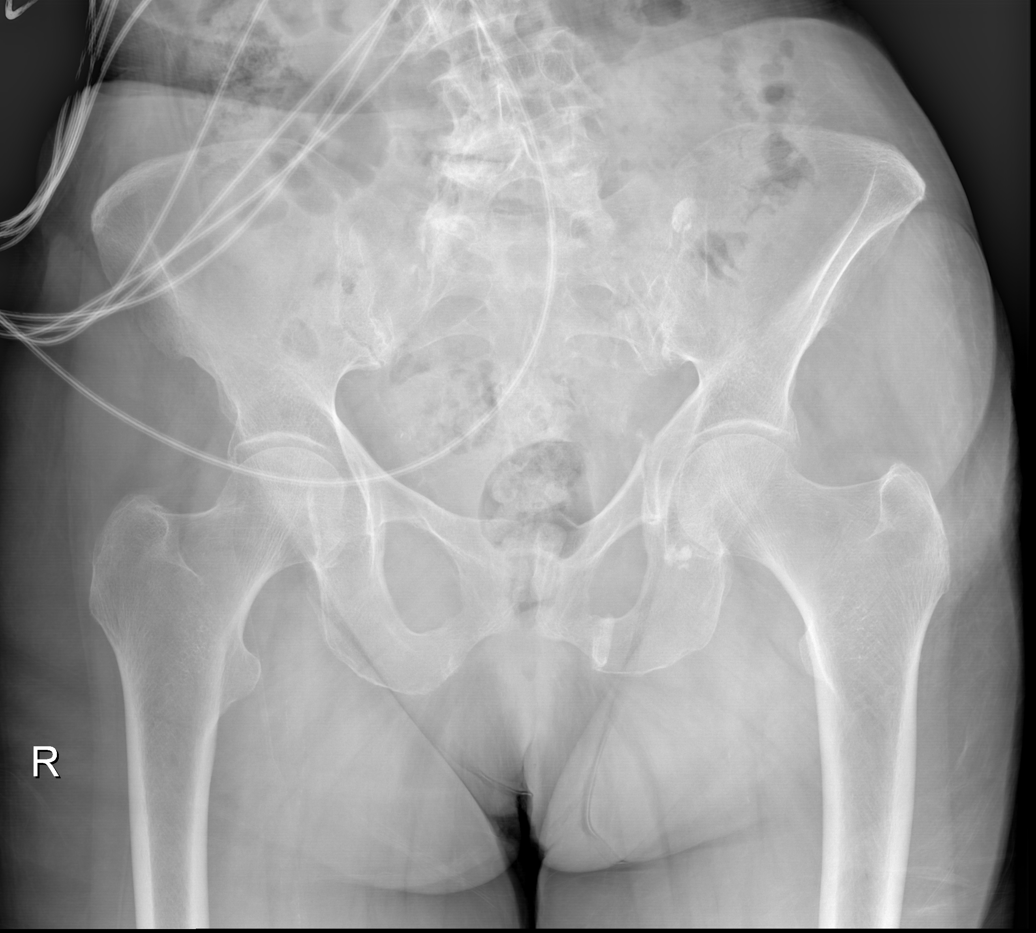

[x hip ap left]
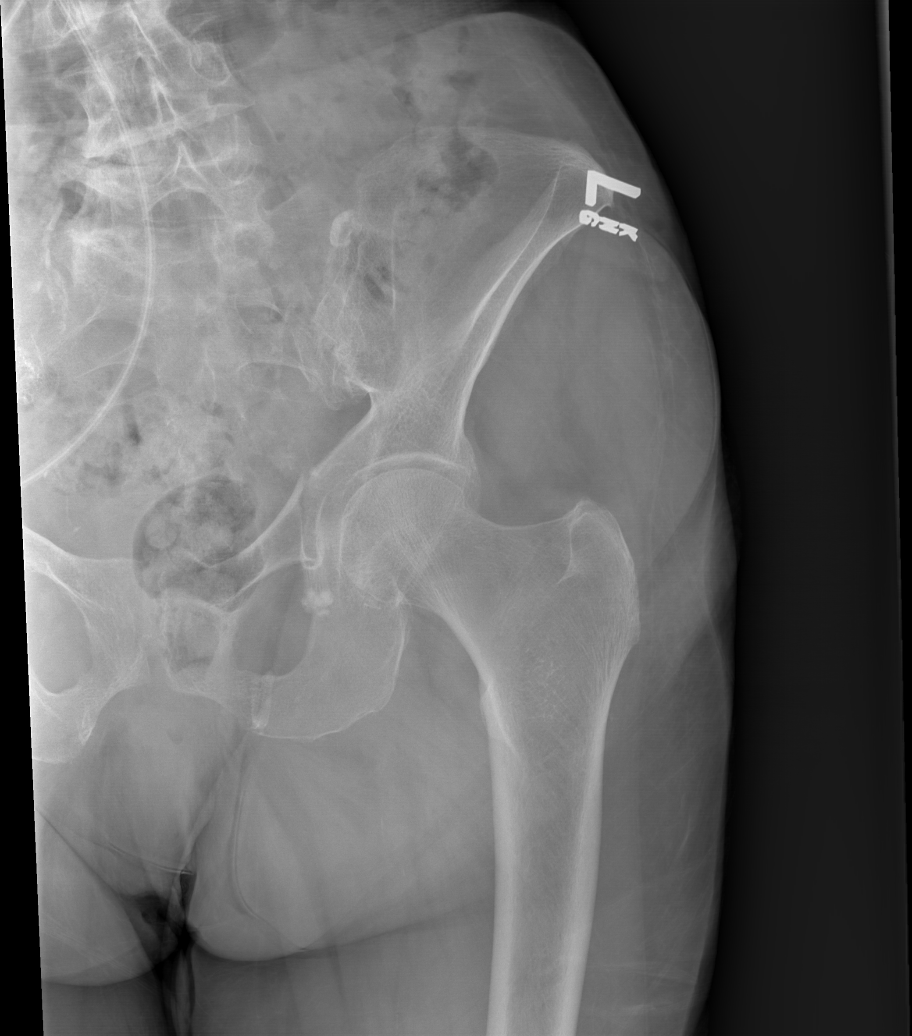

[x hip ap right]
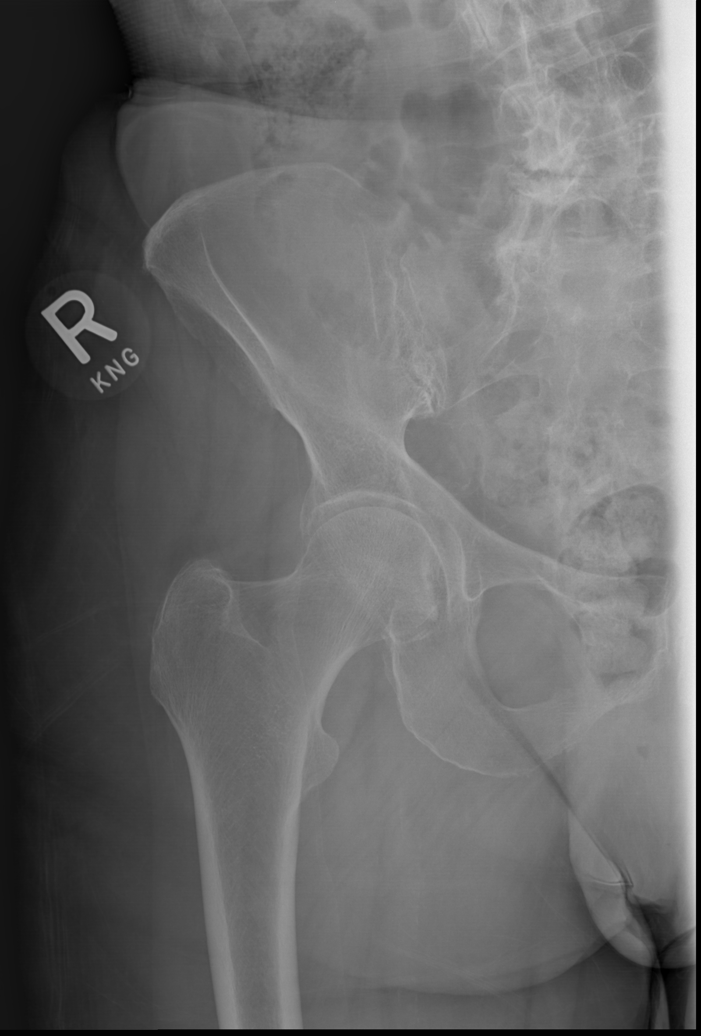

[x hip lat right]
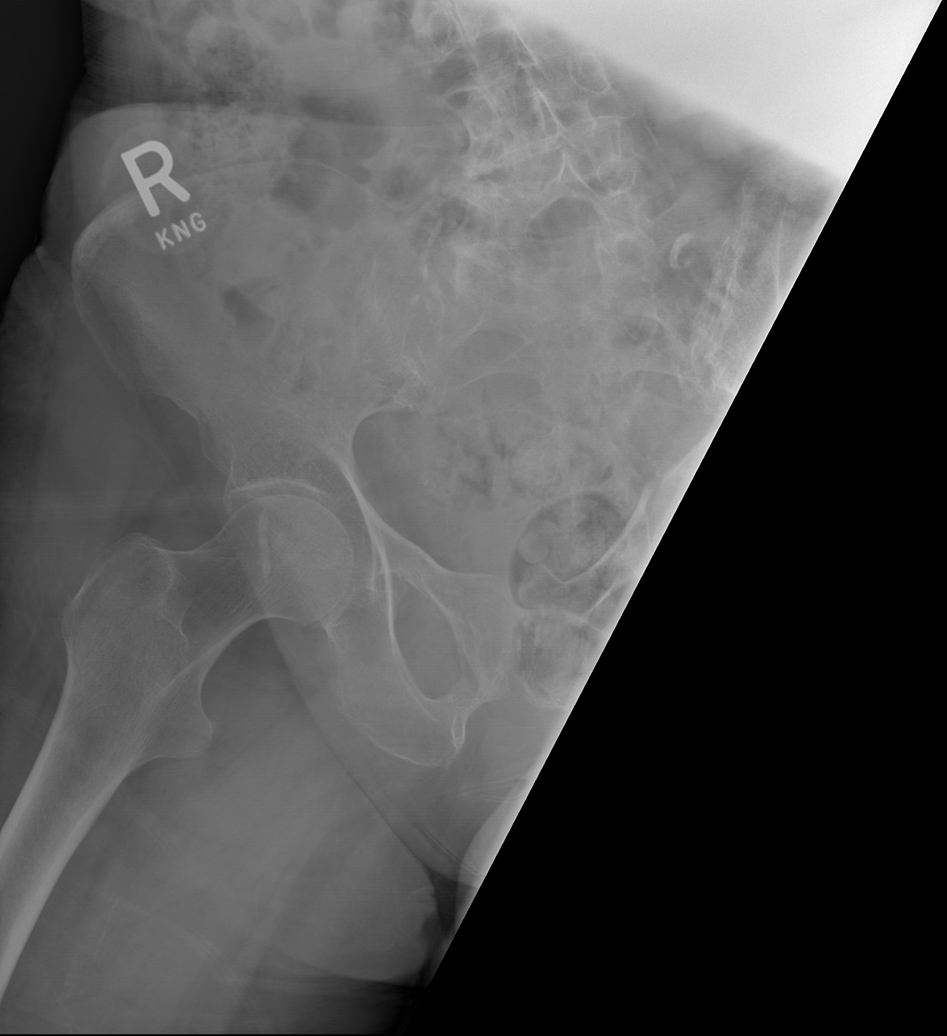

[w hip lat left]
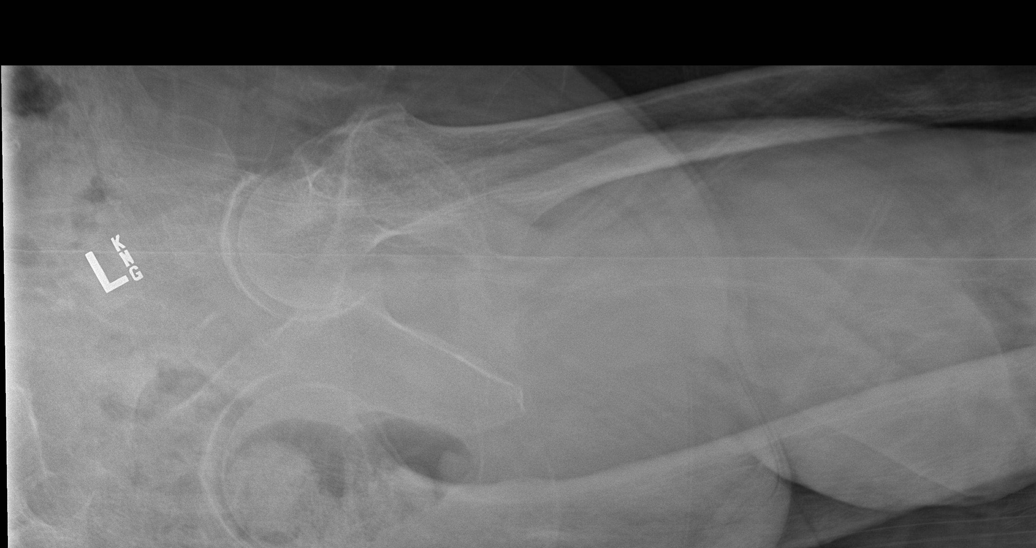

[5 of 5 positions shown; findings below may reference images not displayed]

FINDINGS: Mild SI joint degenerative changes. Right femoral head projects in
joint. Possible old right superior pubic ramus fracture.

Proximal left femur is intact. Acute fractures involving the left
superior and inferior pubic rami.
IMPRESSION: 1. Acute minimally displaced fractures involving the left superior
and inferior pubic rami
2. No definite acute osseous abnormality of the right hip

## 2019-11-24 IMAGING — RF DG C-ARM 61-120 MIN
1 series · 2 of 2 positions shown · non-contrast
Comparison: 05/24/2017.

CLINICAL DATA: [AGE] female with left humerus fracture.
Subsequent encounter.

EXAM:
DG C-ARM 61-120 MIN; LEFT HUMERUS - 2+ VIEW
Fluoroscopic time: 55 seconds.

[Series 1: run · 2 of 2 slices shown]
[im 1/2]
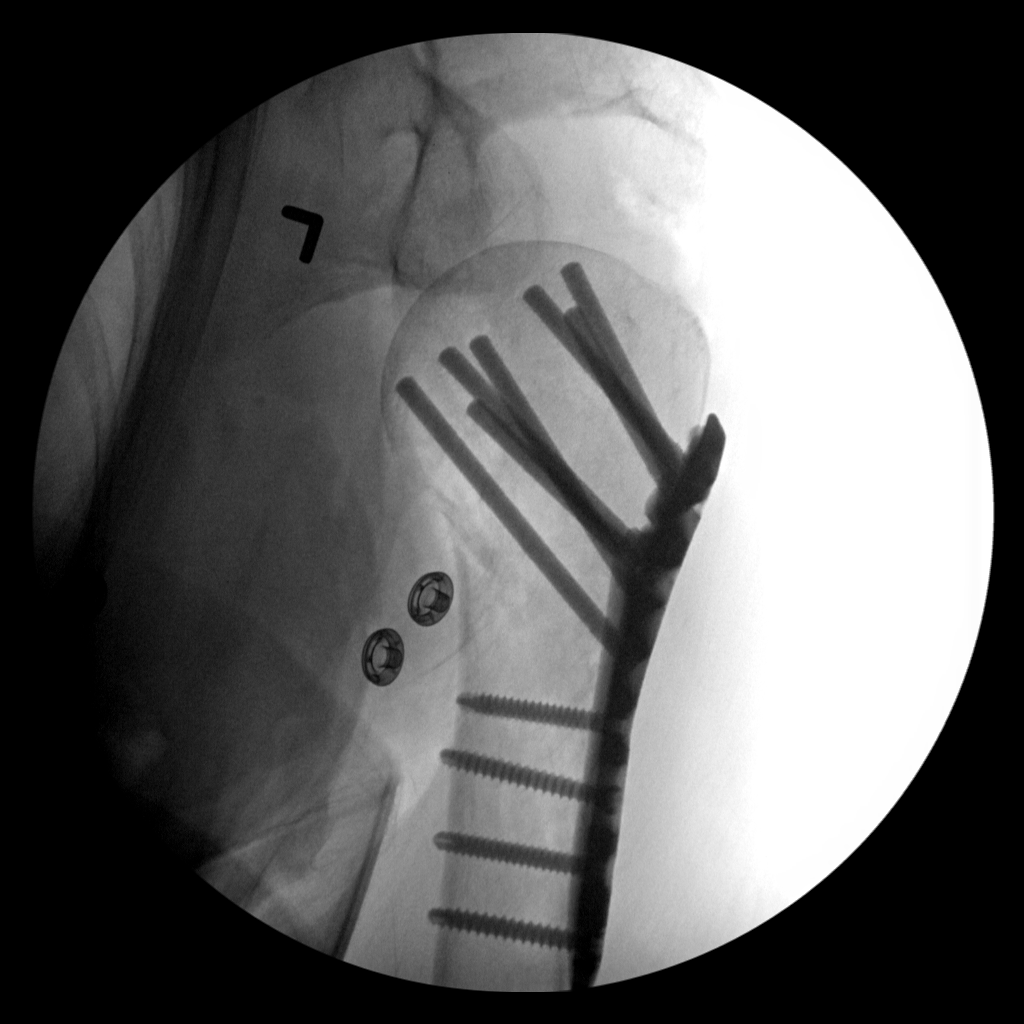
[im 2/2]
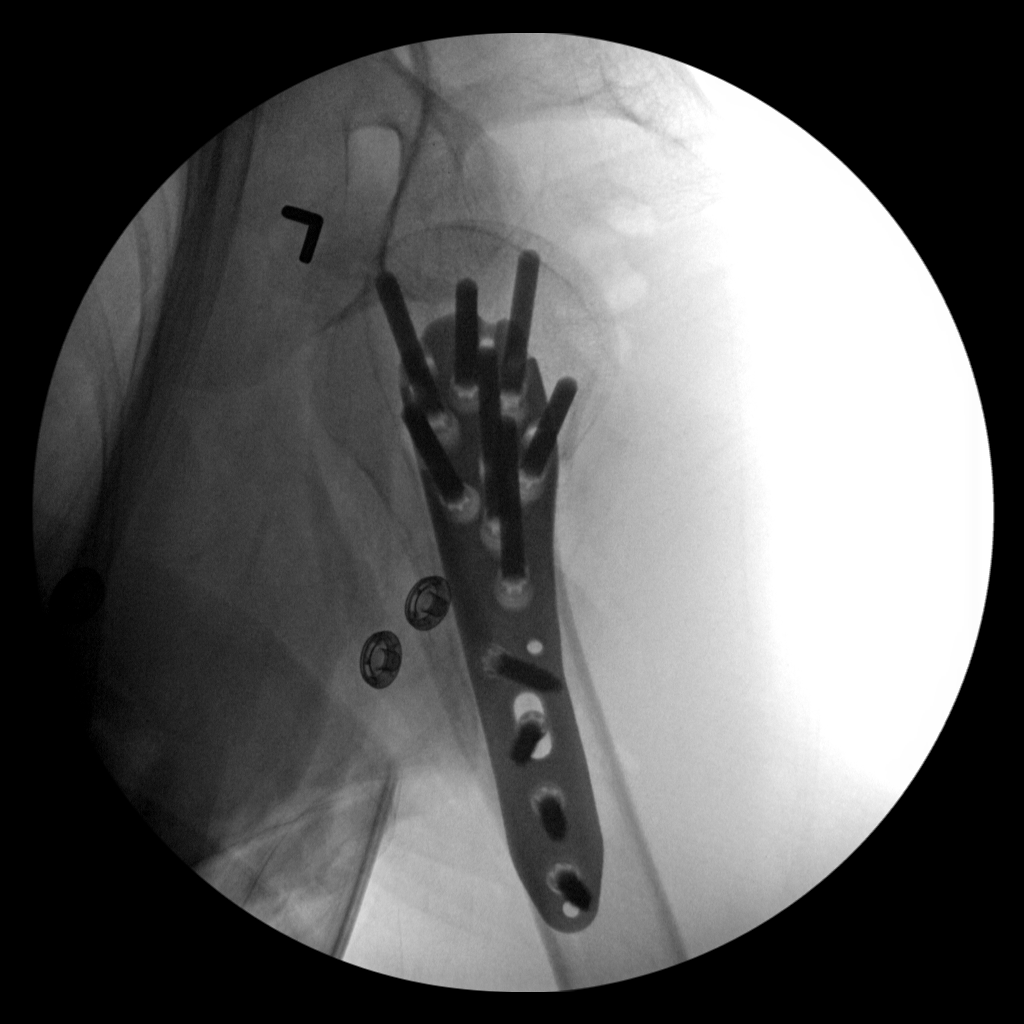

[2 of 2 positions shown; findings below may reference images not displayed]

FINDINGS: Two intraoperative C-arm views submitted for review after surgery.
This reveals plate and screws transfixing left humeral surgical neck
fracture.
IMPRESSION: Open reduction and internal fixation left humeral surgical neck
fracture.
